# Patient Record
Sex: Female | Born: 1945 | Race: Black or African American | Hispanic: No | Marital: Married | Smoking: Current some day smoker
Health system: Southern US, Community
[De-identification: ages and names within clinical notes are randomized; demographics above are authoritative.]

---
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
## 1976-11-18 HISTORY — PX: TUBAL LIGATION: SHX77

## 2012-07-19 ENCOUNTER — Inpatient Hospital Stay (HOSPITAL_COMMUNITY)
Admission: EM | Admit: 2012-07-19 | Discharge: 2012-07-30 | DRG: 337 | Disposition: A | Discharge status: Home / Self Care | Payer: Medicare (Managed Care) | Attending: General Surgery | Admitting: General Surgery

## 2012-07-19 ENCOUNTER — Encounter (HOSPITAL_COMMUNITY): Payer: Self-pay | Admitting: Emergency Medicine

## 2012-07-19 ENCOUNTER — Emergency Department (HOSPITAL_COMMUNITY): Payer: Medicare (Managed Care)

## 2012-07-19 DIAGNOSIS — K560 Paralytic ileus: Secondary | ICD-10-CM | POA: Diagnosis not present

## 2012-07-19 DIAGNOSIS — R109 Unspecified abdominal pain: Secondary | ICD-10-CM

## 2012-07-19 DIAGNOSIS — R112 Nausea with vomiting, unspecified: Secondary | ICD-10-CM | POA: Diagnosis present

## 2012-07-19 DIAGNOSIS — F17200 Nicotine dependence, unspecified, uncomplicated: Secondary | ICD-10-CM | POA: Diagnosis present

## 2012-07-19 DIAGNOSIS — K56609 Unspecified intestinal obstruction, unspecified as to partial versus complete obstruction: Secondary | ICD-10-CM | POA: Diagnosis present

## 2012-07-19 DIAGNOSIS — K565 Intestinal adhesions [bands], unspecified as to partial versus complete obstruction: Principal | ICD-10-CM | POA: Diagnosis present

## 2012-07-19 DIAGNOSIS — Z5331 Laparoscopic surgical procedure converted to open procedure: Secondary | ICD-10-CM

## 2012-07-19 LAB — COMPREHENSIVE METABOLIC PANEL
AST: 21 U/L (ref 0–37)
Albumin: 4.4 g/dL (ref 3.5–5.2)
Alkaline Phosphatase: 85 U/L (ref 39–117)
Chloride: 97 mEq/L (ref 96–112)
Potassium: 3.6 mEq/L (ref 3.5–5.1)
Total Bilirubin: 0.5 mg/dL (ref 0.3–1.2)

## 2012-07-19 LAB — CBC WITH DIFFERENTIAL/PLATELET
Basophils Absolute: 0 10*3/uL (ref 0.0–0.1)
Basophils Relative: 0 % (ref 0–1)
Hemoglobin: 15.8 g/dL — ABNORMAL HIGH (ref 12.0–15.0)
MCHC: 34.4 g/dL (ref 30.0–36.0)
Neutro Abs: 6.9 10*3/uL (ref 1.7–7.7)
Neutrophils Relative %: 57 % (ref 43–77)
Platelets: 263 10*3/uL (ref 150–400)
RDW: 13.8 % (ref 11.5–15.5)

## 2012-07-19 LAB — LIPASE, BLOOD: Lipase: 34 U/L (ref 11–59)

## 2012-07-19 MED ORDER — SODIUM CHLORIDE 0.9 % IV BOLUS (SEPSIS): 1000.0000 mL | Freq: Once | INTRAVENOUS | Status: DC

## 2012-07-19 MED ORDER — FAMOTIDINE IN NACL 20-0.9 MG/50ML-% IV SOLN
20.0000 mg | Freq: Once | INTRAVENOUS | Status: AC
  Administered 2012-07-19: 20 mg via INTRAVENOUS
  Filled 2012-07-19: qty 50

## 2012-07-19 MED ORDER — SODIUM CHLORIDE 0.9 % IV SOLN: 1000.0000 mL | INTRAVENOUS | Status: DC

## 2012-07-19 MED ORDER — SODIUM CHLORIDE 0.9 % IV SOLN
1000.0000 mL | Freq: Once | INTRAVENOUS | Status: AC
  Administered 2012-07-19: 1000 mL via INTRAVENOUS

## 2012-07-19 MED ORDER — ONDANSETRON HCL 4 MG/2ML IJ SOLN
4.0000 mg | Freq: Once | INTRAMUSCULAR | Status: AC
  Administered 2012-07-19: 4 mg via INTRAVENOUS
  Filled 2012-07-19: qty 2

## 2012-07-19 MED ORDER — GI COCKTAIL ~~LOC~~
30.0000 mL | Freq: Once | ORAL | Status: AC
  Administered 2012-07-19: 30 mL via ORAL
  Filled 2012-07-19: qty 30

## 2012-07-19 MED ORDER — SODIUM CHLORIDE 0.9 % IV BOLUS (SEPSIS)
1000.0000 mL | Freq: Once | INTRAVENOUS | Status: AC
  Administered 2012-07-19: 1000 mL via INTRAVENOUS

## 2012-07-19 NOTE — ED Notes (Signed)
Pt made aware of need for urine specimen. Will continue to monitor 

## 2012-07-19 NOTE — ED Notes (Signed)
ZOX:[id number]<AV> Expected date:07/19/12<BR> Expected time: 6:01 PM<BR> Means of arrival:Ambulance<BR> Comments:<BR> Knee Injury

## 2012-07-19 NOTE — ED Provider Notes (Signed)
History     CSN: [id number]  Arrival date & time 07/19/12  8295   First MD Initiated Contact with Patient 07/19/12 1904      Chief Complaint  Patient presents with  . Emesis  . Abdominal Pain    (Consider location/radiation/quality/duration/timing/severity/associated sxs/prior treatment) HPI  Patient relates she's here visiting from [place]. She states 2 nights ago the family went out PE and she and another woman ate fried shrimp, salad and chicken that was pink in the middle. She reports about 6 hours later which was around midnight they both got sick. Her friend however was improved by the next morning. She states she had diffuse abdominal pain with nausea and vomiting. She denies any diarrhea or urgency or defecate. She's unsure of having fever. She states she has some soreness in every once in a while gets a small pain that shoots through her abdomen. She states she feels weak but she denies orthostasis. She also has had some decreased urine output. She was seen at an urgent care and they thought she had free air on her x-rays which I have reviewed. I do not see the free air there the PA thought was present.  PCP OOT  History reviewed. No pertinent past medical history.  Past Surgical History  Procedure Date  . Ovarian cyst surgery 1966  . Appendectomy 1965  . Tubal ligation 1978    No family history on file.  History  Substance Use Topics  . Smoking status: Current Some Day Smoker -- 0.5 packs/day  . Smokeless tobacco: Not on file  . Alcohol Use: No  visiting from [place]  OB History    Grav Para Term Preterm Abortions TAB SAB Ect Mult Living                  Review of Systems  All other systems reviewed and are negative.    Allergies  Penicillins  Home Medications  No current outpatient prescriptions on file.  BP 164/104  Pulse 64  Temp 98.9 F (37.2 C) (Oral)  Resp 18  SpO2 100%  Vital signs normal    Physical Exam  Nursing note and vitals  reviewed. Constitutional: She is oriented to person, place, and time. She appears well-developed and well-nourished.  Non-toxic appearance. She does not appear ill. No distress.       Patient is smiling and pleasant sitting up in her stretcher  HENT:  Head: Normocephalic and atraumatic.  Right Ear: External ear normal.  Left Ear: External ear normal.  Nose: Nose normal. No mucosal edema or rhinorrhea.  Mouth/Throat: Oropharynx is clear and moist and mucous membranes are normal. No dental abscesses or uvula swelling.  Eyes: Conjunctivae and EOM are normal. Pupils are equal, round, and reactive to light.  Neck: Normal range of motion and full passive range of motion without pain. Neck supple.  Cardiovascular: Normal rate, regular rhythm and normal heart sounds.  Exam reveals no gallop and no friction rub.   No murmur heard. Pulmonary/Chest: Effort normal and breath sounds normal. No respiratory distress. She has no wheezes. She has no rhonchi. She has no rales. She exhibits no tenderness and no crepitus.  Abdominal: Soft. Normal appearance and bowel sounds are normal. She exhibits no distension. There is tenderness. There is no rebound and no guarding.       Patient has some mild tenderness in her epigastric and left upper quadrant. There is no guarding or rebound.  Musculoskeletal: Normal range of motion. She exhibits  no edema and no tenderness.       Moves all extremities well.   Neurological: She is alert and oriented to person, place, and time. She has normal strength. No cranial nerve deficit.  Skin: Skin is warm, dry and intact. No rash noted. No erythema. No pallor.  Psychiatric: She has a normal mood and affect. Her speech is normal and behavior is normal. Her mood appears not anxious.    ED Course  Procedures (including critical care time)   Medications  sodium chloride 0.9 % bolus 1,000 mL (not administered)  0.9 %  sodium chloride infusion (1000 mL Intravenous New Bag/Given  07/19/12 2023)    Followed by  0.9 %  sodium chloride infusion (not administered)  ondansetron (ZOFRAN) injection 4 mg (4 mg Intravenous Given 07/19/12 2027)  sodium chloride 0.9 % bolus 1,000 mL (1000 mL Intravenous Given 07/19/12 2242)  gi cocktail (Maalox,Lidocaine,Donnatal) (30 mL Oral Given 07/19/12 2237)  famotidine (PEPCID) IVPB 20 mg (20 mg Intravenous Given 07/19/12 2237)   Pt continues to have mild pain in her epigastric/LUQ. Will proceed with CT AP  Results for orders placed during the hospital encounter of 07/19/12  CBC WITH DIFFERENTIAL      Component Value Range   WBC 11.9 (*) 4.0 - 10.5 K/uL   RBC 5.10  3.87 - 5.11 MIL/uL   Hemoglobin 15.8 (*) 12.0 - 15.0 g/dL   HCT 16.1  09.6 - 04.5 %   MCV 90.0  78.0 - 100.0 fL   MCH 31.0  26.0 - 34.0 pg   MCHC 34.4  30.0 - 36.0 g/dL   RDW 40.9  81.1 - 91.4 %   Platelets 263  150 - 400 K/uL   Neutrophils Relative 57  43 - 77 %   Neutro Abs 6.9  1.7 - 7.7 K/uL   Lymphocytes Relative 32  12 - 46 %   Lymphs Abs 3.9  0.7 - 4.0 K/uL   Monocytes Relative 10  3 - 12 %   Monocytes Absolute 1.1 (*) 0.1 - 1.0 K/uL   Eosinophils Relative 0  0 - 5 %   Eosinophils Absolute 0.0  0.0 - 0.7 K/uL   Basophils Relative 0  0 - 1 %   Basophils Absolute 0.0  0.0 - 0.1 K/uL  COMPREHENSIVE METABOLIC PANEL      Component Value Range   Sodium 136  135 - 145 mEq/L   Potassium 3.6  3.5 - 5.1 mEq/L   Chloride 97  96 - 112 mEq/L   CO2 27  19 - 32 mEq/L   Glucose, Bld 139 (*) 70 - 99 mg/dL   BUN 20  6 - 23 mg/dL   Creatinine, Ser 7.82 (*) 0.50 - 1.10 mg/dL   Calcium 95.6  8.4 - 21.3 mg/dL   Total Protein 7.8  6.0 - 8.3 g/dL   Albumin 4.4  3.5 - 5.2 g/dL   AST 21  0 - 37 U/L   ALT 11  0 - 35 U/L   Alkaline Phosphatase 85  39 - 117 U/L   Total Bilirubin 0.5  0.3 - 1.2 mg/dL   GFR calc non Af Amer 46 (*) >90 mL/min   GFR calc Af Amer 53 (*) >90 mL/min  LIPASE, BLOOD      Component Value Range   Lipase 34  11 - 59 U/L     1. Abdominal pain   2. Nausea  and/or vomiting     Disposition per Dr Weldon Inches  MDM           Ward Givens, MD 07/20/12 0040

## 2012-07-19 NOTE — ED Notes (Signed)
Pt presents w/ 2 day hx of emesis w/ severe abdominal pain, initially pt treated as food poisoning as her companion was also sick. Friday noc developed severe lower abdominal pain, hiccoughs that left a funny taste in her mouth. Entire abdomen is sore. Last emesis 2330 last p.m. No food intake since Friday noc.

## 2012-07-19 NOTE — ED Notes (Signed)
Sent from [facility] : Acute abdomen, possible free air rt costaphrenic angle, obstructive sigmoid/dilated bowel, elevated WBC. Copy of CT scan sent to radiology for reading by our radiologist.

## 2012-07-20 ENCOUNTER — Encounter (HOSPITAL_COMMUNITY): Payer: Self-pay | Admitting: Surgery

## 2012-07-20 DIAGNOSIS — K565 Intestinal adhesions [bands], unspecified as to partial versus complete obstruction: Secondary | ICD-10-CM

## 2012-07-20 DIAGNOSIS — K56609 Unspecified intestinal obstruction, unspecified as to partial versus complete obstruction: Secondary | ICD-10-CM | POA: Diagnosis present

## 2012-07-20 LAB — URINALYSIS, ROUTINE W REFLEX MICROSCOPIC
Glucose, UA: NEGATIVE mg/dL
Hgb urine dipstick: NEGATIVE
Ketones, ur: NEGATIVE mg/dL
Protein, ur: NEGATIVE mg/dL
pH: 6.5 (ref 5.0–8.0)

## 2012-07-20 MED ORDER — ONDANSETRON HCL 4 MG/2ML IJ SOLN
4.0000 mg | Freq: Four times a day (QID) | INTRAMUSCULAR | Status: DC | PRN
  Administered 2012-07-23: 4 mg via INTRAVENOUS
  Filled 2012-07-20 (×2): qty 2

## 2012-07-20 MED ORDER — PHENOL 1.4 % MT LIQD
1.0000 | OROMUCOSAL | Status: DC | PRN
  Administered 2012-07-22: 1 via OROMUCOSAL
  Filled 2012-07-20: qty 177

## 2012-07-20 MED ORDER — IOHEXOL 300 MG/ML  SOLN
100.0000 mL | Freq: Once | INTRAMUSCULAR | Status: AC | PRN
  Administered 2012-07-20: 100 mL via INTRAVENOUS

## 2012-07-20 MED ORDER — KCL IN DEXTROSE-NACL 20-5-0.45 MEQ/L-%-% IV SOLN
INTRAVENOUS | Status: DC
  Administered 2012-07-20 – 2012-07-25 (×7): via INTRAVENOUS
  Administered 2012-07-25: 125 mL/h via INTRAVENOUS
  Administered 2012-07-26: 06:00:00 via INTRAVENOUS
  Administered 2012-07-26: 125 mL/h via INTRAVENOUS
  Administered 2012-07-27 – 2012-07-29 (×4): via INTRAVENOUS
  Filled 2012-07-20 (×29): qty 1000

## 2012-07-20 MED ORDER — HYDROMORPHONE HCL PF 1 MG/ML IJ SOLN
1.0000 mg | INTRAMUSCULAR | Status: DC | PRN
  Administered 2012-07-20 – 2012-07-22 (×3): 1 mg via INTRAVENOUS
  Filled 2012-07-20 (×3): qty 1

## 2012-07-20 MED ORDER — PANTOPRAZOLE SODIUM 40 MG IV SOLR
40.0000 mg | Freq: Every day | INTRAVENOUS | Status: DC
  Administered 2012-07-20 – 2012-07-29 (×10): 40 mg via INTRAVENOUS
  Filled 2012-07-20 (×11): qty 40

## 2012-07-20 NOTE — ED Notes (Signed)
Received call from Dr Sid Falcon, will be rounding to assess patient this am . First thing. Will make patient aware that Attend doctor will see her and assign her a floor as soon as he can.

## 2012-07-20 NOTE — ED Notes (Signed)
After insertion, and placement checking of NG tube patient placed to low wall suction, got out immediate tan to clear secretion - 

## 2012-07-20 NOTE — H&P (Signed)
[name] is an 66 y.o. female.   General Surgery [facility]  Chief Complaint: abdominal pain, nausea and vomiting - small bowel obstruction  HPI: patient is a 66 year old black female from [place], [place], traveling through [place] vacation.  The patient developed lower abdominal pain followed by onset of nausea and vomiting 3 days ago. She suspected food poisoning. However she denies fevers or chills. She has had no bowel movements over the past 2 days. She denies passing flatus. With persistent nausea and vomiting, the patient presented to the emergency department for evaluation. CT scan of the abdomen and pelvis shows evidence of small bowel up structure likely secondary to adhesions.  Past surgical history is notable for appendectomy and left oophorectomy.  Medical history is otherwise unremarkable  Patient is admitted to the surgical service for management of small bowel obstruction.  History reviewed. No pertinent past medical history.  Past Surgical History  Procedure Date  . Ovarian cyst surgery 1966  . Appendectomy 1965  . Tubal ligation 1978    No family history on file. Social History:  reports that she has been smoking.  She does not have any smokeless tobacco history on file. She reports that she does not drink alcohol or use illicit drugs.  Allergies:  Allergies  Allergen Reactions  . Penicillins Anaphylaxis     (Not in a hospital admission)  Results for orders placed during the hospital encounter of 07/19/12 (from the past 48 hour(s))  CBC WITH DIFFERENTIAL     Status: Abnormal   Collection Time   07/19/12  8:00 PM      Component Value Range Comment   WBC 11.9 (*) 4.0 - 10.5 K/uL    RBC 5.10  3.87 - 5.11 MIL/uL    Hemoglobin 15.8 (*) 12.0 - 15.0 g/dL    HCT 16.1  09.6 - 04.5 %    MCV 90.0  78.0 - 100.0 fL    MCH 31.0  26.0 - 34.0 pg    MCHC 34.4  30.0 - 36.0 g/dL    RDW 40.9  81.1 - 91.4 %    Platelets 263  150 - 400 K/uL     Neutrophils Relative 57  43 - 77 %    Neutro Abs 6.9  1.7 - 7.7 K/uL    Lymphocytes Relative 32  12 - 46 %    Lymphs Abs 3.9  0.7 - 4.0 K/uL    Monocytes Relative 10  3 - 12 %    Monocytes Absolute 1.1 (*) 0.1 - 1.0 K/uL    Eosinophils Relative 0  0 - 5 %    Eosinophils Absolute 0.0  0.0 - 0.7 K/uL    Basophils Relative 0  0 - 1 %    Basophils Absolute 0.0  0.0 - 0.1 K/uL   COMPREHENSIVE METABOLIC PANEL     Status: Abnormal   Collection Time   07/19/12  8:00 PM      Component Value Range Comment   Sodium 136  135 - 145 mEq/L    Potassium 3.6  3.5 - 5.1 mEq/L    Chloride 97  96 - 112 mEq/L    CO2 27  19 - 32 mEq/L    Glucose, Bld 139 (*) 70 - 99 mg/dL    BUN 20  6 - 23 mg/dL    Creatinine, Ser 7.82 (*) 0.50 - 1.10 mg/dL    Calcium 95.6  8.4 - 10.5 mg/dL    Total Protein  7.8  6.0 - 8.3 g/dL    Albumin 4.4  3.5 - 5.2 g/dL    AST 21  0 - 37 U/L    ALT 11  0 - 35 U/L    Alkaline Phosphatase 85  39 - 117 U/L    Total Bilirubin 0.5  0.3 - 1.2 mg/dL    GFR calc non Af Amer 46 (*) >90 mL/min    GFR calc Af Amer 53 (*) >90 mL/min   LIPASE, BLOOD     Status: Normal   Collection Time   07/19/12  8:00 PM      Component Value Range Comment   Lipase 34  11 - 59 U/L   URINALYSIS, ROUTINE W REFLEX MICROSCOPIC     Status: Abnormal   Collection Time   07/20/12  3:26 AM      Component Value Range Comment   Color, Urine YELLOW  YELLOW    APPearance CLEAR  CLEAR    Specific Gravity, Urine 1.034 (*) 1.005 - 1.030    pH 6.5  5.0 - 8.0    Glucose, UA NEGATIVE  NEGATIVE mg/dL    Hgb urine dipstick NEGATIVE  NEGATIVE    Bilirubin Urine NEGATIVE  NEGATIVE    Ketones, ur NEGATIVE  NEGATIVE mg/dL    Protein, ur NEGATIVE  NEGATIVE mg/dL    Urobilinogen, UA 0.2  0.0 - 1.0 mg/dL    Nitrite NEGATIVE  NEGATIVE    Leukocytes, UA NEGATIVE  NEGATIVE MICROSCOPIC NOT DONE ON URINES WITH NEGATIVE PROTEIN, BLOOD, LEUKOCYTES, NITRITE, OR GLUCOSE <1000 mg/dL.   Ct Abdomen Pelvis W Contrast  07/20/2012   *RADIOLOGY REPORT*  Clinical Data: Lower abdominal pain, nausea, vomiting, abnormal outside radiographs demonstrating bowel dilatation and question free air; WBC = 11.9 K  CT ABDOMEN AND PELVIS WITH CONTRAST  Technique:  Multidetector CT imaging of the abdomen and pelvis was performed following the standard protocol during bolus administration of intravenous contrast. Sagittal and coronal MPR images reconstructed from axial data set.  Contrast: OMNIPAQUE IOHEXOL 300 MG/ML  SOLN Dilute oral contrast.  Comparison: None  Findings: Lung bases clear. Small cyst within liver, largest right lobe 12 x 9 mm image 32. Liver, spleen, pancreas, kidneys, and right adrenal gland otherwise normal appearance. Left adrenal nodule 2.1 x 1.3 cm image 27, showing significant washout of contrast on delayed images, question small adenoma.  Mildly distended stomach with dilated proximal and decompressed distal small bowel loops compatible with small bowel obstruction. Transition zone from dilated to nondilated small bowel occurs in the left pelvis. Appendix surgically absent by history. Small amount of nonspecific free pelvic fluid. Unremarkable bladder and uterus. Minimal diverticulosis of sigmoid colon without evidence of diverticulitis. No mass, adenopathy, free air, or hernia. No acute osseous findings.  IMPRESSION: Small bowel obstruction with transition from dilated to nondilated small bowel occurring in the left pelvis, question adhesion. Small amount free pelvic fluid is seen but no definite bowel wall thickening or free intraperitoneal air identified. Small left adrenal nodule 2.1 x 1.3 cm question adenoma. Sigmoid diverticulosis.   Original Report Authenticated By: Lollie Marrow, M.D.     Review of Systems  Constitutional: Negative.   HENT: Negative.   Eyes: Negative.   Respiratory: Negative.   Cardiovascular: Negative.   Gastrointestinal: Positive for nausea, vomiting and abdominal pain. Negative for heartburn,  diarrhea, constipation, blood in stool and melena.  Genitourinary: Negative.   Musculoskeletal: Negative.   Skin: Negative.   Neurological: Negative.  Endo/Heme/Allergies: Negative.   Psychiatric/Behavioral: Negative.     Blood pressure 161/64, pulse 63, temperature 98.9 F (37.2 C), temperature source Oral, resp. rate 18, SpO2 95.00%. Physical Exam  Constitutional: She is oriented to person, place, and time. She appears well-developed and well-nourished. No distress.  HENT:  Head: Normocephalic and atraumatic.  Right Ear: External ear normal.  Left Ear: External ear normal.  Nose: Nose normal.  Mouth/Throat: Oropharynx is clear and moist.  Eyes: Conjunctivae and EOM are normal. Pupils are equal, round, and reactive to light. No scleral icterus.  Neck: Normal range of motion. Neck supple. No tracheal deviation present. No thyromegaly present.  Cardiovascular: Normal rate, regular rhythm and normal heart sounds.   No murmur heard. Respiratory: Effort normal and breath sounds normal. No respiratory distress. She has no wheezes. She has no rales.  GI: Soft. Bowel sounds are normal. She exhibits distension (mild). She exhibits no mass. There is tenderness (mild, diffuse). There is no rebound and no guarding.  Musculoskeletal: Normal range of motion. She exhibits no edema and no tenderness.  Lymphadenopathy:    She has no cervical adenopathy.  Neurological: She is alert and oriented to person, place, and time.  Skin: Skin is warm and dry. She is not diaphoretic.  Psychiatric: She has a normal mood and affect. Her behavior is normal. Judgment and thought content normal.     Assessment/Plan Small bowel obstruction, likely secondary to adhesions  - NG decompression, IV hydration, NPO except ice chips  - ambulate  - repeat labs and AXR in AM 9/3  Velora Heckler, MD, [facility] Office: [phone]    Nhi Butrum M 07/20/2012, 8:01 AM

## 2012-07-20 NOTE — ED Provider Notes (Addendum)
Assumed care from Dr. Lynelle Doctor.  Reviewed the chart and reevaluated the patient.  I agree with the assessment and plan.  I reviewed the CAT scan, which showed a bowel obstruction.  Therefore, I ordered an NG tube and explained the necessary.  Treatment and need for hospitalization to the patient  Courtney Guppy, MD 07/20/12 0347  4:07 AM Spoke with Dr. Gerrit Friends. He will admit for tx of sbo.   He agrees with plan for ng and ivf.   Courtney Guppy, MD 07/20/12 [phone]

## 2012-07-21 ENCOUNTER — Inpatient Hospital Stay (HOSPITAL_COMMUNITY): Payer: Medicare (Managed Care)

## 2012-07-21 LAB — CBC
Hemoglobin: 13.8 g/dL (ref 12.0–15.0)
MCH: 31 pg (ref 26.0–34.0)
RBC: 4.45 MIL/uL (ref 3.87–5.11)
WBC: 7.7 10*3/uL (ref 4.0–10.5)

## 2012-07-21 LAB — BASIC METABOLIC PANEL
CO2: 25 mEq/L (ref 19–32)
GFR calc non Af Amer: 54 mL/min — ABNORMAL LOW (ref >90)
Glucose, Bld: 144 mg/dL — ABNORMAL HIGH (ref 70–99)
Potassium: 3.7 mEq/L (ref 3.5–5.1)
Sodium: 132 mEq/L — ABNORMAL LOW (ref 135–145)

## 2012-07-21 LAB — PROTIME-INR: Prothrombin Time: 13.5 seconds (ref 11.6–15.2)

## 2012-07-21 MED ORDER — ENOXAPARIN SODIUM 40 MG/0.4ML ~~LOC~~ SOLN
40.0000 mg | SUBCUTANEOUS | Status: DC
  Administered 2012-07-21 – 2012-07-22 (×2): 40 mg via SUBCUTANEOUS
  Filled 2012-07-21 (×3): qty 0.4

## 2012-07-21 NOTE — Progress Notes (Signed)
Subjective: No n/v. No abd pain. Feel somewhat better. No flatus/bm. +multiple voids. NG about 700  Objective: Vital signs in last 24 hours: Temp:  [97.4 F (36.3 C)-98.8 F (37.1 C)] 97.4 F (36.3 C) (09/03 0615) Pulse Rate:  [50-60] 60  (09/03 0615) Resp:  [16-18] 18  (09/03 0615) BP: (146-186)/(51-84) 147/67 mmHg (09/03 0615) SpO2:  [94 %-100 %] 96 % (09/03 0615) Weight:  [180 lb 3.2 oz (81.738 kg)] 180 lb 3.2 oz (81.738 kg) (09/02 1300)    Intake/Output from previous day: 09/02 0701 - 09/03 0700 In: 2725.8 [I.V.:2695.8; NG/GT:30] Out: 600 [Emesis/NG output:600] Intake/Output this shift:    Alert, approp cta Reg Soft, obese, nt +scds  Lab Results:   Basename 07/21/12 0440 07/19/12 2000  WBC 7.7 11.9*  HGB 13.8 15.8*  HCT 40.6 45.9  PLT 216 263   BMET  Basename 07/21/12 0440 07/19/12 2000  NA 132* 136  K 3.7 3.6  CL 99 97  CO2 25 27  GLUCOSE 144* 139*  BUN 12 20  CREATININE 1.05 1.20*  CALCIUM 8.4 10.2   PT/INR  Basename 07/21/12 0440  LABPROT 13.5  INR 1.01   ABG No results found for this basename: PHART:2,PCO2:2,PO2:2,HCO3:2 in the last 72 hours  Studies/Results: Ct Abdomen Pelvis W Contrast  07/20/2012  *RADIOLOGY REPORT*  Clinical Data: Lower abdominal pain, nausea, vomiting, abnormal outside radiographs demonstrating bowel dilatation and question free air; WBC = 11.9 K  CT ABDOMEN AND PELVIS WITH CONTRAST  Technique:  Multidetector CT imaging of the abdomen and pelvis was performed following the standard protocol during bolus administration of intravenous contrast. Sagittal and coronal MPR images reconstructed from axial data set.  Contrast: OMNIPAQUE IOHEXOL 300 MG/ML  SOLN Dilute oral contrast.  Comparison: None  Findings: Lung bases clear. Small cyst within liver, largest right lobe 12 x 9 mm image 32. Liver, spleen, pancreas, kidneys, and right adrenal gland otherwise normal appearance. Left adrenal nodule 2.1 x 1.3 cm image 27, showing  significant washout of contrast on delayed images, question small adenoma.  Mildly distended stomach with dilated proximal and decompressed distal small bowel loops compatible with small bowel obstruction. Transition zone from dilated to nondilated small bowel occurs in the left pelvis. Appendix surgically absent by history. Small amount of nonspecific free pelvic fluid. Unremarkable bladder and uterus. Minimal diverticulosis of sigmoid colon without evidence of diverticulitis. No mass, adenopathy, free air, or hernia. No acute osseous findings.  IMPRESSION: Small bowel obstruction with transition from dilated to nondilated small bowel occurring in the left pelvis, question adhesion. Small amount free pelvic fluid is seen but no definite bowel wall thickening or free intraperitoneal air identified. Small left adrenal nodule 2.1 x 1.3 cm question adenoma. Sigmoid diverticulosis.   Original Report Authenticated By: Lollie Marrow, M.D.    Dg Abd Portable 2v  07/21/2012  *RADIOLOGY REPORT*  Clinical Data: Small bowel obstruction  PORTABLE ABDOMEN - 2 VIEW  Comparison: 07/20/2012 CT abdomen and pelvis  Findings: Persistent dilatation of small bowel loops containing dilute contrast. Nasogastric tube within decompressed stomach. No free intraperitoneal air. Lung bases clear. 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 Osseous demineralization.  IMPRESSION: Persistent dilatation of fluid-filled small bowel loops consistent with obstruction.   Original Report Authenticated By: Lollie Marrow, M.D.     Anti-infectives: Anti-infectives    None      Assessment/Plan: pSBO - no fever, tachy, leukocytosis; cont non-surgical management for now. Repeat labs and imaging in am. Discussed disease  process with family. Start lovenox for DVT prophylaxis.  [name]. Andrey Campanile, MD, FACS General, Bariatric, & Minimally Invasive Surgery [facility] [place]   LOS: 2 days    Atilano Ina 07/21/2012

## 2012-07-21 NOTE — Care Management Note (Signed)
    Page 1 of 1   07/30/2012     11:42:18 AM   CARE MANAGEMENT NOTE 07/30/2012  Patient:  [name], [name]   Account Number:  [id number]  Date Initiated:  07/21/2012  Documentation initiated by:  Lorenda Ishihara  Subjective/Objective Assessment:   66 yo female admitted with SBO. PTA lived in [place], traveling thru [place].     Action/Plan:   Anticipated DC Date:  07/31/2012   Anticipated DC Plan:  HOME/SELF CARE      DC Planning Services  CM consult      Choice offered to / List presented to:             Status of service:  Completed, signed off Medicare Important Message given?   (If response is "NO", the following Medicare IM given date fields will be blank) Date Medicare IM given:   Date Additional Medicare IM given:    Discharge Disposition:  HOME/SELF CARE  Per UR Regulation:  Reviewed for med. necessity/level of care/duration of stay  If discussed at Long Length of Stay Meetings, dates discussed:    Comments:  07-24-12 Lorenda Ishihara RN CM to OR 07-23-12 for SBO, lysis of adhesions

## 2012-07-22 ENCOUNTER — Inpatient Hospital Stay (HOSPITAL_COMMUNITY): Payer: Medicare (Managed Care)

## 2012-07-22 LAB — CBC WITH DIFFERENTIAL/PLATELET
HCT: 38.8 % (ref 36.0–46.0)
Hemoglobin: 13.3 g/dL (ref 12.0–15.0)
Lymphocytes Relative: 36 % (ref 12–46)
Lymphs Abs: 2.4 10*3/uL (ref 0.7–4.0)
MCHC: 34.3 g/dL (ref 30.0–36.0)
Monocytes Absolute: 0.7 10*3/uL (ref 0.1–1.0)
Monocytes Relative: 11 % (ref 3–12)
Neutro Abs: 3.4 10*3/uL (ref 1.7–7.7)
Neutrophils Relative %: 51 % (ref 43–77)
RBC: 4.25 MIL/uL (ref 3.87–5.11)
WBC: 6.6 10*3/uL (ref 4.0–10.5)

## 2012-07-22 LAB — BASIC METABOLIC PANEL
BUN: 9 mg/dL (ref 6–23)
Chloride: 102 mEq/L (ref 96–112)
GFR calc Af Amer: 62 mL/min — ABNORMAL LOW (ref >90)
Potassium: 3.9 mEq/L (ref 3.5–5.1)
Sodium: 134 mEq/L — ABNORMAL LOW (ref 135–145)

## 2012-07-22 MED ORDER — MENTHOL 3 MG MT LOZG
1.0000 | LOZENGE | OROMUCOSAL | Status: DC | PRN
  Administered 2012-07-24: 3 mg via ORAL
  Filled 2012-07-22 (×3): qty 9

## 2012-07-22 NOTE — Progress Notes (Signed)
Subjective: No abd pain, just "sore"; no nausea; no flatus; "had a little squirt from bottom"; ambulated several times yesterday  Objective: Vital signs in last 24 hours: Temp:  [97.9 F (36.6 C)-98.2 F (36.8 C)] 98.1 F (36.7 C) 08/04/2023 0625) Pulse Rate:  [60-63] 60  Aug 04, 2023 0625) Resp:  [17-18] 18  08/04/23 0625) BP: (137-174)/(69-82) 137/71 mmHg 08-04-2023 0625) SpO2:  [96 %-97 %] 97 % 08/04/23 0625) Last BM Date: 07/21/12  Intake/Output from previous day: 09/03 0701 - 2023-08-04 0700 In: 1648.3 [I.V.:1608.3; NG/GT:40] Out: 2500 [Urine:2150; Emesis/NG output:350] Intake/Output this shift: Total I/O In: 1903.3 [I.V.:1883.3; NG/GT:20] Out: 750 [Urine:250; Emesis/NG output:500]  Alert, nad cta Reg Soft, mild distension; some scattered BS. Nontender. +scds  Lab Results:   Basename 08/03/12 0410 07/21/12 0440  WBC 6.6 7.7  HGB 13.3 13.8  HCT 38.8 40.6  PLT 187 216   BMET  Basename 08-03-12 0410 07/21/12 0440  NA 134* 132*  K 3.9 3.7  CL 102 99  CO2 22 25  GLUCOSE 138* 144*  BUN 9 12  CREATININE 1.06 1.05  CALCIUM 8.5 8.4   PT/INR  Basename 07/21/12 0440  LABPROT 13.5  INR 1.01   ABG No results found for this basename: PHART:2,PCO2:2,PO2:2,HCO3:2 in the last 72 hours  Studies/Results: Dg Abd 2 Views  08-03-2012  *RADIOLOGY REPORT*  Clinical Data: Follow-up small bowel obstruction.  ABDOMEN - 2 VIEW  Comparison: 07/21/2012.  Findings: The NG tube is in the stomach.  The tip is likely in the region of the duodenal bulb.  There are scattered mildly dilated small bowel loops with air-fluid levels consistent with a small bowel obstruction.  No significant change.  IMPRESSION: Persistent small bowel obstruction bowel gas pattern.  No free air.   Original Report Authenticated By: P. Loralie Champagne, M.D.    Dg Abd Portable 2v  07/21/2012  *RADIOLOGY REPORT*  Clinical Data: Small bowel obstruction  PORTABLE ABDOMEN - 2 VIEW  Comparison: 07/20/2012 CT abdomen and pelvis   Findings: Persistent dilatation of small bowel loops containing dilute contrast. Nasogastric tube within decompressed stomach. No free intraperitoneal air. Lung bases clear. 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 Osseous demineralization.  IMPRESSION: Persistent dilatation of fluid-filled small bowel loops consistent with obstruction.   Original Report Authenticated By: Lollie Marrow, M.D.     Anti-infectives: Anti-infectives    None      Assessment/Plan: pSBO  Pt still without significant bowel function. No BM. Little to NO flatus. However no tachy, fever, or leukocytosis. abd films unchanged. Discussed on-going non-surgical management vs surgical management for persistent PSBO.  Discussed role of diagnostic laparoscopy, possible exp lap, lysis of adhesions, possible bowel resection. Discussed risk and benefits of surgery including, but not limited to bleeding, infection (such as wound infection, abdominal abscess), injury to surrounding structures, blood clot formation, urinary retention, incisional hernia, possible anastomotic stricture, possible anastomotic leak, anesthesia risks, pulmonary & cardiac complications such as pneumonia &/or heart attack, need for additional procedures, ileus, & prolonged hospitalization.  We discussed the typical postoperative recovery course, including limitations & restrictions postoperatively. I explained that the likelihood of improvement in their symptoms is good.  Discussed possibility that intestines could get sick (ishcemia) if obstruction worsens which would increase her risk for complications. Pt's husband at bedside.   Pt would like to think about it for some time.  Will re-evaluate in AM.  If no improvement by Thursday, I told the patient we should proceed with surgery in my opinion  [name]. Andrey Campanile, MD, FACS General, Bariatric, & Minimally Invasive Surgery [facility], [place]    LOS: 3 days    Atilano Ina 07/22/2012

## 2012-07-23 ENCOUNTER — Encounter (HOSPITAL_COMMUNITY): Payer: Self-pay | Admitting: Anesthesiology

## 2012-07-23 ENCOUNTER — Encounter (HOSPITAL_COMMUNITY): Payer: Self-pay

## 2012-07-23 ENCOUNTER — Encounter (HOSPITAL_COMMUNITY): Admission: EM | Disposition: A | Discharge status: Home / Self Care | Payer: Self-pay | Source: Home / Self Care

## 2012-07-23 ENCOUNTER — Inpatient Hospital Stay (HOSPITAL_COMMUNITY): Payer: Medicare (Managed Care) | Admitting: Anesthesiology

## 2012-07-23 HISTORY — PX: LAPAROTOMY: SHX154

## 2012-07-23 HISTORY — PX: LAPAROSCOPY: SHX197

## 2012-07-23 LAB — CBC WITH DIFFERENTIAL/PLATELET
Basophils Absolute: 0 10*3/uL (ref 0.0–0.1)
Basophils Relative: 0 % (ref 0–1)
Eosinophils Relative: 1 % (ref 0–5)
HCT: 42 % (ref 36.0–46.0)
Hemoglobin: 14.3 g/dL (ref 12.0–15.0)
Lymphocytes Relative: 37 % (ref 12–46)
MCHC: 34 g/dL (ref 30.0–36.0)
MCV: 90.7 fL (ref 78.0–100.0)
Monocytes Absolute: 0.6 10*3/uL (ref 0.1–1.0)
Monocytes Relative: 9 % (ref 3–12)
Neutro Abs: 3.5 10*3/uL (ref 1.7–7.7)
RDW: 13.2 % (ref 11.5–15.5)

## 2012-07-23 LAB — BASIC METABOLIC PANEL
BUN: 8 mg/dL (ref 6–23)
CO2: 22 mEq/L (ref 19–32)
Chloride: 102 mEq/L (ref 96–112)
Creatinine, Ser: 1.09 mg/dL (ref 0.50–1.10)
Glucose, Bld: 118 mg/dL — ABNORMAL HIGH (ref 70–99)
Potassium: 4 mEq/L (ref 3.5–5.1)

## 2012-07-23 SURGERY — LAPAROSCOPY, DIAGNOSTIC
Anesthesia: General

## 2012-07-23 MED ORDER — SODIUM CHLORIDE 0.9 % IJ SOLN: 9.0000 mL | INTRAMUSCULAR | Status: DC | PRN

## 2012-07-23 MED ORDER — CIPROFLOXACIN IN D5W 400 MG/200ML IV SOLN
400.0000 mg | INTRAVENOUS | Status: AC
  Administered 2012-07-23: 400 mg via INTRAVENOUS
  Filled 2012-07-23: qty 200

## 2012-07-23 MED ORDER — MORPHINE SULFATE (PF) 1 MG/ML IV SOLN
INTRAVENOUS | Status: DC
  Administered 2012-07-23: 1 mg via INTRAVENOUS
  Administered 2012-07-23: 21:00:00 via INTRAVENOUS
  Administered 2012-07-23: 7 mg via INTRAVENOUS
  Administered 2012-07-23: 8 mg via INTRAVENOUS
  Administered 2012-07-24: 3 mg via INTRAVENOUS
  Administered 2012-07-24: 4 mg via INTRAVENOUS
  Administered 2012-07-24: 11 mg via INTRAVENOUS
  Administered 2012-07-24: 9 mg via INTRAVENOUS
  Administered 2012-07-24: 25 mg via INTRAVENOUS
  Administered 2012-07-24: 05:00:00 via INTRAVENOUS
  Administered 2012-07-24: 0.199 mg via INTRAVENOUS
  Administered 2012-07-25: 10:00:00 via INTRAVENOUS
  Administered 2012-07-25: 7 mg via INTRAVENOUS
  Administered 2012-07-25: 9 mg via INTRAVENOUS
  Administered 2012-07-25: 6 mg via INTRAVENOUS
  Administered 2012-07-25: 5 mg via INTRAVENOUS
  Administered 2012-07-25: 3 mg via INTRAVENOUS
  Administered 2012-07-25: 17 mg via INTRAVENOUS
  Administered 2012-07-26: 2 mg via INTRAVENOUS
  Administered 2012-07-26: 1 mg via INTRAVENOUS
  Administered 2012-07-26: 2 mg via INTRAVENOUS
  Administered 2012-07-26: 1 mg via INTRAVENOUS
  Administered 2012-07-26: 3 mg via INTRAVENOUS
  Administered 2012-07-27: 2 mg via INTRAVENOUS
  Administered 2012-07-27 – 2012-07-28 (×5): 1 mg via INTRAVENOUS
  Administered 2012-07-28 (×2): 2 mg via INTRAVENOUS
  Filled 2012-07-23 (×5): qty 25

## 2012-07-23 MED ORDER — PROMETHAZINE HCL 25 MG/ML IJ SOLN: 6.2500 mg | INTRAMUSCULAR | Status: DC | PRN

## 2012-07-23 MED ORDER — SUFENTANIL CITRATE 50 MCG/ML IV SOLN
INTRAVENOUS | Status: DC | PRN
  Administered 2012-07-23 (×3): 10 ug via INTRAVENOUS
  Administered 2012-07-23: 20 ug via INTRAVENOUS

## 2012-07-23 MED ORDER — HYDROMORPHONE HCL PF 1 MG/ML IJ SOLN
0.2500 mg | INTRAMUSCULAR | Status: DC | PRN
  Administered 2012-07-23 (×4): 0.5 mg via INTRAVENOUS

## 2012-07-23 MED ORDER — LACTATED RINGERS IV SOLN
INTRAVENOUS | Status: DC
  Administered 2012-07-23: 1000 mL via INTRAVENOUS

## 2012-07-23 MED ORDER — DEXAMETHASONE SODIUM PHOSPHATE 10 MG/ML IJ SOLN
INTRAMUSCULAR | Status: DC | PRN
  Administered 2012-07-23: 10 mg via INTRAVENOUS

## 2012-07-23 MED ORDER — SUCCINYLCHOLINE CHLORIDE 20 MG/ML IJ SOLN
INTRAMUSCULAR | Status: DC | PRN
  Administered 2012-07-23: 80 mg via INTRAVENOUS

## 2012-07-23 MED ORDER — CISATRACURIUM BESYLATE (PF) 10 MG/5ML IV SOLN
INTRAVENOUS | Status: DC | PRN
  Administered 2012-07-23: 3 mg via INTRAVENOUS
  Administered 2012-07-23: 5 mg via INTRAVENOUS

## 2012-07-23 MED ORDER — LABETALOL HCL 5 MG/ML IV SOLN
5.0000 mg | INTRAVENOUS | Status: DC | PRN
  Administered 2012-07-23 (×2): 5 mg via INTRAVENOUS

## 2012-07-23 MED ORDER — KCL IN DEXTROSE-NACL 20-5-0.45 MEQ/L-%-% IV SOLN
INTRAVENOUS | Status: AC
  Administered 2012-07-23: 1000 mL via INTRAVENOUS
  Filled 2012-07-23: qty 1000

## 2012-07-23 MED ORDER — ONDANSETRON HCL 4 MG/2ML IJ SOLN
INTRAMUSCULAR | Status: DC | PRN
  Administered 2012-07-23: 4 mg via INTRAVENOUS

## 2012-07-23 MED ORDER — GLYCOPYRROLATE 0.2 MG/ML IJ SOLN
INTRAMUSCULAR | Status: DC | PRN
  Administered 2012-07-23: .8 mg via INTRAVENOUS

## 2012-07-23 MED ORDER — LABETALOL HCL 5 MG/ML IV SOLN
INTRAVENOUS | Status: AC
  Filled 2012-07-23: qty 4

## 2012-07-23 MED ORDER — MIDAZOLAM HCL 5 MG/5ML IJ SOLN
INTRAMUSCULAR | Status: DC | PRN
  Administered 2012-07-23: 2 mg via INTRAVENOUS

## 2012-07-23 MED ORDER — HYDROMORPHONE HCL PF 1 MG/ML IJ SOLN
INTRAMUSCULAR | Status: AC
  Filled 2012-07-23: qty 1

## 2012-07-23 MED ORDER — ENOXAPARIN SODIUM 40 MG/0.4ML ~~LOC~~ SOLN
40.0000 mg | SUBCUTANEOUS | Status: DC
  Administered 2012-07-24 – 2012-07-30 (×7): 40 mg via SUBCUTANEOUS
  Filled 2012-07-23 (×8): qty 0.4

## 2012-07-23 MED ORDER — DIPHENHYDRAMINE HCL 12.5 MG/5ML PO ELIX: 12.5000 mg | ORAL_SOLUTION | Freq: Four times a day (QID) | ORAL | Status: DC | PRN

## 2012-07-23 MED ORDER — SODIUM CHLORIDE 0.9 % IR SOLN
Status: DC | PRN
  Administered 2012-07-23: 1000 mL

## 2012-07-23 MED ORDER — LIDOCAINE HCL (CARDIAC) 20 MG/ML IV SOLN
INTRAVENOUS | Status: DC | PRN
  Administered 2012-07-23: 100 mg via INTRAVENOUS

## 2012-07-23 MED ORDER — LACTATED RINGERS IV SOLN
INTRAVENOUS | Status: DC | PRN
  Administered 2012-07-23 (×2): via INTRAVENOUS

## 2012-07-23 MED ORDER — NEOSTIGMINE METHYLSULFATE 1 MG/ML IJ SOLN
INTRAMUSCULAR | Status: DC | PRN
  Administered 2012-07-23: 5 mg via INTRAVENOUS

## 2012-07-23 MED ORDER — DIPHENHYDRAMINE HCL 50 MG/ML IJ SOLN: 12.5000 mg | Freq: Four times a day (QID) | INTRAMUSCULAR | Status: DC | PRN

## 2012-07-23 MED ORDER — METOPROLOL TARTRATE 1 MG/ML IV SOLN
5.0000 mg | Freq: Four times a day (QID) | INTRAVENOUS | Status: DC | PRN
  Administered 2012-07-23 – 2012-07-24 (×2): 5 mg via INTRAVENOUS
  Filled 2012-07-23 (×2): qty 5

## 2012-07-23 MED ORDER — PROPOFOL 10 MG/ML IV BOLUS
INTRAVENOUS | Status: DC | PRN
  Administered 2012-07-23: 150 mg via INTRAVENOUS

## 2012-07-23 MED ORDER — NALOXONE HCL 0.4 MG/ML IJ SOLN: 0.4000 mg | INTRAMUSCULAR | Status: DC | PRN

## 2012-07-23 MED ORDER — METOPROLOL TARTRATE 1 MG/ML IV SOLN: 5.0000 mg | Freq: Four times a day (QID) | INTRAVENOUS | Status: DC | PRN

## 2012-07-23 MED ORDER — ACETAMINOPHEN 10 MG/ML IV SOLN
INTRAVENOUS | Status: DC | PRN
  Administered 2012-07-23: 1000 mg via INTRAVENOUS

## 2012-07-23 MED ORDER — BUPIVACAINE-EPINEPHRINE PF 0.25-1:200000 % IJ SOLN
INTRAMUSCULAR | Status: DC | PRN
  Administered 2012-07-23: 20 mL

## 2012-07-23 SURGICAL SUPPLY — 84 items
APPLICATOR COTTON TIP 6IN STRL (MISCELLANEOUS) ×2 IMPLANT
APPLIER CLIP 5 13 M/L LIGAMAX5 (MISCELLANEOUS)
APPLIER CLIP ROT 10 11.4 M/L (STAPLE)
BLADE EXTENDED COATED 6.5IN (ELECTRODE) ×2 IMPLANT
BLADE HEX COATED 2.75 (ELECTRODE) ×4 IMPLANT
BLADE SURG SZ10 CARB STEEL (BLADE) IMPLANT
CANISTER SUCTION 2500CC (MISCELLANEOUS) ×2 IMPLANT
CANNULA ENDOPATH XCEL 11M (ENDOMECHANICALS) IMPLANT
CHLORAPREP W/TINT 26ML (MISCELLANEOUS) ×2 IMPLANT
CLIP APPLIE 5 13 M/L LIGAMAX5 (MISCELLANEOUS) IMPLANT
CLIP APPLIE ROT 10 11.4 M/L (STAPLE) IMPLANT
CLIP TI LARGE 6 (CLIP) IMPLANT
CLOTH BEACON ORANGE TIMEOUT ST (SAFETY) ×2 IMPLANT
COVER MAYO STAND STRL (DRAPES) ×2 IMPLANT
COVER SURGICAL LIGHT HANDLE (MISCELLANEOUS) ×2 IMPLANT
DECANTER SPIKE VIAL GLASS SM (MISCELLANEOUS) ×2 IMPLANT
DERMABOND ADVANCED (GAUZE/BANDAGES/DRESSINGS)
DERMABOND ADVANCED .7 DNX12 (GAUZE/BANDAGES/DRESSINGS) IMPLANT
DRAPE LAPAROSCOPIC ABDOMINAL (DRAPES) ×2 IMPLANT
DRAPE LG THREE QUARTER DISP (DRAPES) IMPLANT
DRAPE UTILITY XL STRL (DRAPES) ×2 IMPLANT
DRAPE WARM FLUID 44X44 (DRAPE) ×2 IMPLANT
DRESSING TELFA 8X3 (GAUZE/BANDAGES/DRESSINGS) ×2 IMPLANT
DRSG PAD ABDOMINAL 8X10 ST (GAUZE/BANDAGES/DRESSINGS) ×2 IMPLANT
ELECT REM PT RETURN 9FT ADLT (ELECTROSURGICAL) ×2
ELECTRODE REM PT RTRN 9FT ADLT (ELECTROSURGICAL) ×1 IMPLANT
FILTER SMOKE EVAC LAPAROSHD (FILTER) IMPLANT
GLOVE BIO SURGEON STRL SZ7 (GLOVE) ×2 IMPLANT
GLOVE BIO SURGEON STRL SZ7.5 (GLOVE) ×4 IMPLANT
GLOVE BIOGEL M 7.0 STRL (GLOVE) ×2 IMPLANT
GLOVE BIOGEL M STRL SZ7.5 (GLOVE) IMPLANT
GLOVE BIOGEL PI IND STRL 7.0 (GLOVE) ×1 IMPLANT
GLOVE BIOGEL PI INDICATOR 7.0 (GLOVE) ×1
GLOVE ECLIPSE 7.5 STRL STRAW (GLOVE) ×2 IMPLANT
GLOVE INDICATOR 8.0 STRL GRN (GLOVE) ×4 IMPLANT
GOWN PREVENTION PLUS LG XLONG (DISPOSABLE) ×6 IMPLANT
GOWN PREVENTION PLUS XLARGE (GOWN DISPOSABLE) ×2 IMPLANT
GOWN STRL NON-REIN LRG LVL3 (GOWN DISPOSABLE) ×6 IMPLANT
GOWN STRL REIN XL XLG (GOWN DISPOSABLE) ×4 IMPLANT
KIT BASIN OR (CUSTOM PROCEDURE TRAY) ×2 IMPLANT
LEGGING LITHOTOMY PAIR STRL (DRAPES) IMPLANT
LIGASURE IMPACT 36 18CM CVD LR (INSTRUMENTS) IMPLANT
NS IRRIG 1000ML POUR BTL (IV SOLUTION) ×4 IMPLANT
PACK GENERAL/GYN (CUSTOM PROCEDURE TRAY) ×2 IMPLANT
PENCIL BUTTON HOLSTER BLD 10FT (ELECTRODE) ×2 IMPLANT
SCALPEL HARMONIC ACE (MISCELLANEOUS) IMPLANT
SET IRRIG TUBING LAPAROSCOPIC (IRRIGATION / IRRIGATOR) IMPLANT
SHEARS FOC LG CVD HARMONIC 17C (MISCELLANEOUS) IMPLANT
SOLUTION ANTI FOG 6CC (MISCELLANEOUS) ×2 IMPLANT
SPONGE GAUZE 4X4 12PLY (GAUZE/BANDAGES/DRESSINGS) ×2 IMPLANT
SPONGE LAP 18X18 X RAY DECT (DISPOSABLE) ×2 IMPLANT
STAPLER VISISTAT (STAPLE) ×2 IMPLANT
STAPLER VISISTAT 35W (STAPLE) ×2 IMPLANT
STRIP CLOSURE SKIN 1/2X4 (GAUZE/BANDAGES/DRESSINGS) IMPLANT
SUCTION POOLE TIP (SUCTIONS) ×4 IMPLANT
SUT PDS AB 1 CTX 36 (SUTURE) IMPLANT
SUT PDS AB 1 TP1 96 (SUTURE) ×4 IMPLANT
SUT PDS AB 3-0 CT2 27 (SUTURE) IMPLANT
SUT PDS AB 3-0 SH 27 (SUTURE) ×2 IMPLANT
SUT PDS AB 4-0 SH 27 (SUTURE) IMPLANT
SUT PROLENE 2 0 BLUE (SUTURE) IMPLANT
SUT PROLENE 2 0 KS (SUTURE) IMPLANT
SUT PROLENE 2 0 SH DA (SUTURE) IMPLANT
SUT SILK 2 0 (SUTURE) ×3
SUT SILK 2 0 SH CR/8 (SUTURE) ×6 IMPLANT
SUT SILK 2 0SH CR/8 30 (SUTURE) IMPLANT
SUT SILK 2-0 18XBRD TIE 12 (SUTURE) ×2 IMPLANT
SUT SILK 2-0 30XBRD TIE 12 (SUTURE) ×1 IMPLANT
SUT SILK 3 0 (SUTURE) ×3
SUT SILK 3 0 SH CR/8 (SUTURE) ×4 IMPLANT
SUT SILK 3-0 18XBRD TIE 12 (SUTURE) ×3 IMPLANT
SYS LAPSCP GELPORT 120MM (MISCELLANEOUS)
SYSTEM LAPSCP GELPORT 120MM (MISCELLANEOUS) IMPLANT
TOWEL OR 17X26 10 PK STRL BLUE (TOWEL DISPOSABLE) ×4 IMPLANT
TRAY FOLEY CATH 14FRSI W/METER (CATHETERS) ×2 IMPLANT
TRAY LAP CHOLE (CUSTOM PROCEDURE TRAY) ×2 IMPLANT
TROCAR BLADELESS OPT 5 75 (ENDOMECHANICALS) ×6 IMPLANT
TROCAR XCEL 12X100 BLDLESS (ENDOMECHANICALS) IMPLANT
TROCAR XCEL BLUNT TIP 100MML (ENDOMECHANICALS) ×2 IMPLANT
TROCAR XCEL NON-BLD 11X100MML (ENDOMECHANICALS) IMPLANT
TUBING INSUFFLATION 10FT LAP (TUBING) ×2 IMPLANT
WATER STERILE IRR 1500ML POUR (IV SOLUTION) ×2 IMPLANT
YANKAUER SUCT BULB TIP 10FT TU (MISCELLANEOUS) ×2 IMPLANT
YANKAUER SUCT BULB TIP NO VENT (SUCTIONS) ×2 IMPLANT

## 2012-07-23 NOTE — OR Nursing (Signed)
1408 late entry by Milana Kidney, RN.

## 2012-07-23 NOTE — Anesthesia Postprocedure Evaluation (Signed)
  Anesthesia Post-op Note  Patient: [name]  Procedure(s) Performed: Procedure(s) (LRB): LAPAROSCOPY DIAGNOSTIC (N/A) EXPLORATORY LAPAROTOMY (N/A) LYSIS OF ADHESION (N/A)  Patient Location: PACU  Anesthesia Type: General  Level of Consciousness: awake and alert   Airway and Oxygen Therapy: Patient Spontanous Breathing  Post-op Pain: mild  Post-op Assessment: Post-op Vital signs reviewed, Patient's Cardiovascular Status Stable, Respiratory Function Stable, Patent Airway and No signs of Nausea or vomiting  Post-op Vital Signs: stable  Complications: No apparent anesthesia complications

## 2012-07-23 NOTE — Anesthesia Procedure Notes (Addendum)
Procedure Name: Intubation Date/Time: 07/23/2012 12:12 PM Performed by: Leroy Libman L Patient Re-evaluated:Patient Re-evaluated prior to inductionOxygen Delivery Method: Circle system utilized Preoxygenation: Pre-oxygenation with 100% oxygen Intubation Type: IV induction, Cricoid Pressure applied and Rapid sequence Laryngoscope Size: Miller and 2 Grade View: Grade II Tube type: Oral Tube size: 7.5 mm Number of attempts: 1 Airway Equipment and Method: Stylet Placement Confirmation: ETT inserted through vocal cords under direct vision,  breath sounds checked- equal and bilateral and positive ETCO2 Secured at: 22 cm Tube secured with: Tape Dental Injury: Teeth and Oropharynx as per pre-operative assessment  Comments: Nasal Gastric tube attached to suction prior to induction

## 2012-07-23 NOTE — Progress Notes (Signed)
  Subjective: No flatus. Still sore. 1-2 pellets (size of cough drops) of hard stool last pm; increasing NG output  Objective: Vital signs in last 24 hours: Temp:  [97.9 F (36.6 C)-98.3 F (36.8 C)] 98.2 F (36.8 C) (09/05 0600) Pulse Rate:  [56-61] 60  (09/05 0600) Resp:  [16-20] 16  (09/05 0600) BP: (164-180)/(72-83) 172/76 mmHg (09/05 0600) SpO2:  [95 %-97 %] 96 % (09/05 0600) Last BM Date: 07/21/12  Intake/Output from previous day: 08-19-2023 0701 - 09/05 0700 In: 4339.8 [I.V.:4299.8; NG/GT:40] Out: 3950 [Urine:1300; Emesis/NG output:2650] Intake/Output this shift:    Alert, nad cta Reg Obese, soft, hypoBS, TTP RLQ to deep palpation  Lab Results:   [facility] 07/23/12 0457 18-Aug-2012 0410  WBC 6.7 6.6  HGB 14.3 13.3  HCT 42.0 38.8  PLT 205 187   BMET  Basename 07/23/12 0457 08-18-12 0410  NA 135 134*  K 4.0 3.9  CL 102 102  CO2 22 22  GLUCOSE 118* 138*  BUN 8 9  CREATININE 1.09 1.06  CALCIUM 8.9 8.5   PT/INR  Basename 07/21/12 0440  LABPROT 13.5  INR 1.01   ABG No results found for this basename: PHART:2,PCO2:2,PO2:2,HCO3:2 in the last 72 hours  Studies/Results: Dg Abd 2 Views  08/18/2012  *RADIOLOGY REPORT*  Clinical Data: Follow-up small bowel obstruction.  ABDOMEN - 2 VIEW  Comparison: 07/21/2012.  Findings: The NG tube is in the stomach.  The tip is likely in the region of the duodenal bulb.  There are scattered mildly dilated small bowel loops with air-fluid levels consistent with a small bowel obstruction.  No significant change.  IMPRESSION: Persistent small bowel obstruction bowel gas pattern.  No free air.   Original Report Authenticated By: P. Loralie Champagne, M.D.     Anti-infectives: Anti-infectives     Start     Dose/Rate Route Frequency Ordered Stop   07/23/12 0745   ciprofloxacin (CIPRO) IVPB 400 mg        400 mg 200 mL/hr over 60 Minutes Intravenous On call to O.R. 07/23/12 0740 07/24/12 0559          Assessment/Plan: psbo  The  pt remains essentially obstipated without any significant improvement in bowel recovery. I believe we have failed non-surgical management. The patient has elected to proceed to the OR for dx laparoscopy, LOA, possible exp lap, possible bowel resection, rare possibility of ostomy  i have extensively counseled the pt regarding the risks/benefits of surgery on 2 occasions yesterday for a total of 30- 40 minutes. All of her questions asked and answered. I discussed the procedure in detail.   We discussed the risks and benefits of surgery yesterday including, but not limited to bleeding, infection (such as wound infection, abdominal abscess), injury to surrounding structures, blood clot formation, urinary retention, incisional hernia, anastomotic stricture, anastomotic leak, anesthesia risks, pulmonary & cardiac complications such as pneumonia &/or heart attack, need for additional procedures, ileus, rare possibility of death & prolonged hospitalization.  We discussed the typical postoperative recovery course, including limitations & restrictions postoperatively. I explained that the likelihood of improvement in their symptoms is good.  [name]. Courtney Campanile, MD, FACS General, Bariatric, & Minimally Invasive Surgery [facility], [place]    LOS: 4 days    [name] 07/23/2012

## 2012-07-23 NOTE — Progress Notes (Signed)
Dr. Council Mechanic in and aware of SBP 190 after pain meds(dilaudid 2mg ). Sleeping after meds. Orders given.

## 2012-07-23 NOTE — Transfer of Care (Signed)
Immediate Anesthesia Transfer of Care Note  Patient: [name]  Procedure(s) Performed: Procedure(s) (LRB) with comments: LAPAROSCOPY DIAGNOSTIC (N/A) EXPLORATORY LAPAROTOMY (N/A) LYSIS OF ADHESION (N/A)  Patient Location: PACU  Anesthesia Type: General  Level of Consciousness: awake, alert  and oriented  Airway & Oxygen Therapy: Patient Spontanous Breathing and Patient connected to face mask oxygen  Post-op Assessment: Report given to PACU RN and Post -op Vital signs reviewed and stable  Post vital signs: Reviewed and stable  Complications: No apparent anesthesia complications

## 2012-07-23 NOTE — Preoperative (Addendum)
Beta Blockers   Reason not to administer Beta Blockers:Not Applicable 

## 2012-07-23 NOTE — Brief Op Note (Signed)
07/19/2012 - 07/23/2012  1:47 PM  PATIENT:  [name]  66 y.o. female  PRE-OPERATIVE DIAGNOSIS:  Partial small bowel obstruction  POST-OPERATIVE DIAGNOSIS: partial small bowel obstruction secondary to closed loop obstruction  PROCEDURE:  Procedure(s) (LRB) with comments: LAPAROSCOPY DIAGNOSTIC (N/A) EXPLORATORY LAPAROTOMY (N/A) LYSIS OF ADHESION (N/A) x 1hr  SURGEON:  Surgeon(s) and Role:    * Atilano Ina, MD,FACS - Primary  PHYSICIAN ASSISTANT: none  ASSISTANTS: Dr Romie Levee   ANESTHESIA:   general  EBL:  Total I/O In: 1000 [I.V.:1000] Out: 175 [Urine:125; Blood:50]  BLOOD ADMINISTERED:none  DRAINS: Nasogastric Tube and Urinary Catheter (Foley)   LOCAL MEDICATIONS USED:  MARCAINE     SPECIMEN:  No Specimen  DISPOSITION OF SPECIMEN:  N/A  COUNTS:  YES  TOURNIQUET:  * No tourniquets in log *  DICTATION: .Other Dictation: Dictation Number [id number]  PLAN OF CARE: Admit to inpatient   PATIENT DISPOSITION:  PACU - hemodynamically stable.   Delay start of Pharmacological VTE agent (>24hrs) due to surgical blood loss or risk of bleeding: no  [name]. Andrey Campanile, MD, FACS General, Bariatric, & Minimally Invasive Surgery [facility], [place]

## 2012-07-23 NOTE — Anesthesia Preprocedure Evaluation (Addendum)
Anesthesia Evaluation  Patient identified by MRN, date of birth, ID band Patient awake    Reviewed: Allergy & Precautions, H&P , NPO status , Patient's Chart, lab work & pertinent test results  Airway Mallampati: II TM Distance: >3 FB Neck ROM: Full    Dental No notable dental hx.    Pulmonary Current Smoker,  breath sounds clear to auscultation  Pulmonary exam normal       Cardiovascular negative cardio ROS  Rhythm:Regular Rate:Normal  ECG normal.   Neuro/Psych negative neurological ROS  negative psych ROS   GI/Hepatic negative GI ROS, Neg liver ROS,   Endo/Other  negative endocrine ROS  Renal/GU negative Renal ROS  negative genitourinary   Musculoskeletal negative musculoskeletal ROS (+)   Abdominal   Peds negative pediatric ROS (+)  Hematology negative hematology ROS (+)   Anesthesia Other Findings   Reproductive/Obstetrics negative OB ROS                          Anesthesia Physical Anesthesia Plan  ASA: II  Anesthesia Plan: General   Post-op Pain Management:    Induction: Intravenous  Airway Management Planned: Oral ETT  Additional Equipment:   Intra-op Plan:   Post-operative Plan: Extubation in OR  Informed Consent: I have reviewed the patients History and Physical, chart, labs and discussed the procedure including the risks, benefits and alternatives for the proposed anesthesia with the patient or authorized representative who has indicated his/her understanding and acceptance.   Dental advisory given  Plan Discussed with: CRNA  Anesthesia Plan Comments:         Anesthesia Quick Evaluation

## 2012-07-24 ENCOUNTER — Encounter (HOSPITAL_COMMUNITY): Payer: Self-pay | Admitting: General Surgery

## 2012-07-24 LAB — BASIC METABOLIC PANEL
BUN: 9 mg/dL (ref 6–23)
Calcium: 8.8 mg/dL (ref 8.4–10.5)
GFR calc Af Amer: 67 mL/min — ABNORMAL LOW (ref >90)
GFR calc non Af Amer: 57 mL/min — ABNORMAL LOW (ref >90)
Potassium: 5.2 mEq/L — ABNORMAL HIGH (ref 3.5–5.1)
Sodium: 132 mEq/L — ABNORMAL LOW (ref 135–145)

## 2012-07-24 LAB — CBC
MCH: 30.8 pg (ref 26.0–34.0)
MCHC: 33.9 g/dL (ref 30.0–36.0)
Platelets: 226 10*3/uL (ref 150–400)
RBC: 4.58 MIL/uL (ref 3.87–5.11)
RDW: 13.1 % (ref 11.5–15.5)

## 2012-07-24 MED ORDER — VITAMINS A & D EX OINT
TOPICAL_OINTMENT | CUTANEOUS | Status: AC
  Administered 2012-07-24: 1
  Filled 2012-07-24: qty 10

## 2012-07-24 NOTE — Progress Notes (Addendum)
1 Day Post-Op  Subjective: Alert. Oriented. Mental status normal. Looks pretty good. Operative events explained to patient. Pain controlled. Denies respiratory difficulty.  Objective: Vital signs in last 24 hours: Temp:  [97.9 F (36.6 C)-98.7 F (37.1 C)] 98.2 F (36.8 C) (09/06 0223) Pulse Rate:  [57-85] 65  (09/06 0223) Resp:  [11-20] 20  (09/06 0223) BP: (141-199)/(60-125) 157/78 mmHg (09/06 0223) SpO2:  [91 %-100 %] 100 % (09/06 0223) Last BM Date: 07/22/12  Intake/Output from previous day: 09/05 0701 - 09/06 0700 In: 3680 [I.V.:3650; NG/GT:30] Out: 2525 [Urine:2425; Emesis/NG output:50; Blood:50] Intake/Output this shift: Total I/O In: 1280 [I.V.:1250; NG/GT:30] Out: 1500 [Urine:1500]  General appearance: OR. Mental status normal. In no obvious distress. Resp: clear to auscultation bilaterally GI: abdomen with midline incision, looks good. No bleeding. Abdomen reasonably soft, appropriate incisional tenderness, silent.  Lab Results:  Results for orders placed during the hospital encounter of 07/19/12 (from the past 24 hour(s))  SURGICAL PCR SCREEN     Status: Normal   Collection Time   07/23/12  8:19 AM      Component Value Range   MRSA, PCR NEGATIVE  NEGATIVE   Staphylococcus aureus NEGATIVE  NEGATIVE  BASIC METABOLIC PANEL     Status: Abnormal   Collection Time   07/24/12  4:21 AM      Component Value Range   Sodium 132 (*) 135 - 145 mEq/L   Potassium 5.2 (*) 3.5 - 5.1 mEq/L   Chloride 100  96 - 112 mEq/L   CO2 22  19 - 32 mEq/L   Glucose, Bld 143 (*) 70 - 99 mg/dL   BUN 9  6 - 23 mg/dL   Creatinine, Ser 4.78  0.50 - 1.10 mg/dL   Calcium 8.8  8.4 - 29.5 mg/dL   GFR calc non Af Amer 57 (*) >90 mL/min   GFR calc Af Amer 67 (*) >90 mL/min  MAGNESIUM     Status: Normal   Collection Time   07/24/12  4:21 AM      Component Value Range   Magnesium 2.3  1.5 - 2.5 mg/dL  CBC     Status: Normal   Collection Time   07/24/12  4:21 AM      Component Value Range   WBC  5.4  4.0 - 10.5 K/uL   RBC 4.58  3.87 - 5.11 MIL/uL   Hemoglobin 14.1  12.0 - 15.0 g/dL   HCT 62.1  30.8 - 65.7 %   MCV 90.8  78.0 - 100.0 fL   MCH 30.8  26.0 - 34.0 pg   MCHC 33.9  30.0 - 36.0 g/dL   RDW 84.6  96.2 - 95.2 %   Platelets 226  150 - 400 K/uL     Studies/Results: @RISRSLT24 @     . ciprofloxacin  400 mg Intravenous On Call to OR  . dextrose 5 % and 0.45 % NaCl with KCl 20 mEq/L      . enoxaparin (LOVENOX) injection  40 mg Subcutaneous Q24H  . HYDROmorphone      . HYDROmorphone      . labetalol      . morphine   Intravenous Q4H  . pantoprazole (PROTONIX) IV  40 mg Intravenous QHS  . DISCONTD: enoxaparin (LOVENOX) injection  40 mg Subcutaneous Q24H     Assessment/Plan: s/p Procedure(s): LAPAROSCOPY DIAGNOSTIC EXPLORATORY LAPAROTOMY LYSIS OF ADHESION  POD #1. Laparotomy and lysis of adhesions for SBO. Stable. Ileus expected. Mobilize out of bed. Continue NG  suction Repeat lab work tomorrow D/c foley tomorrow, at the latest.    LOS: 5 days    Naavya Postma M. Derrell Lolling, M.D., [facility] General and Minimally invasive Surgery Breast and Colorectal Surgery Office:   [phone] Pager:   [phone]  07/24/2012  . .prob

## 2012-07-24 NOTE — Op Note (Signed)
[name], [name]              ACCOUNT NO.:  [id number]  MEDICAL RECORD NO.:  [id number]  LOCATION:  1540                         FACILITY:  [facility]  PHYSICIAN:  [name]. Andrey Campanile, MD, FACSDATE OF BIRTH:  09-15-1946  DATE OF PROCEDURE:  07/23/2012 DATE OF DISCHARGE:                              OPERATIVE REPORT   PREOPERATIVE DIAGNOSIS:  Partial small bowel obstruction.  POSTOPERATIVE DIAGNOSIS:  Partial small bowel obstruction secondary to closed loop obstruction.  PROCEDURE: 1. Diagnostic laparoscopy. 2. Laparoscopic lysis of adhesions for 20 minutes. 3. Exploratory laparotomy, open lysis of adhesions x40 minutes.  SURGEON:  [name]. Andrey Campanile, MD, FACS.  ASSISTANT:  Romie Levee, MD  ANESTHESIA:  General plus local consisting 0.25% Marcaine with epinephrine.  ESTIMATED BLOOD LOSS:  Minimal.  SPECIMEN:  None.  COMPLICATIONS:  None immediately apparent.  FINDINGS:  The patient had omental adhesions to the anterior abdominal wall, however, her source of obstruction was in her distal small bowel. She had a knuckle of omentum creating a closed loop bowel obstruction. The bowel was viable and therefore, no resection was undertaken.  INDICATIONS FOR PROCEDURE:  The patient is a very pleasant 66 year old Afro-American female, who has had a prior lower midline incision for a left oophorectomy, who came into the hospital on September 1 with nausea, vomiting and some abdominal pain.  She was nontoxic appearing. Medical management was instituted for a partial small bowel obstruction with NG tube decompression and bowel rest.  This was continued for the next several days.  However, the patient remained obstipated, although stable without any worsening signs of disease.  She just simply was not getting any better.  We discussed the pros and cons of ongoing observation with nonsurgical management versus surgical management.  We discussed surgery involving a diagnostic laparoscopy  with lysis of adhesions, possible open exploratory laparotomy with possible bowel resection and possible ostomy.  We discussed the risks and benefits at length, including but not limited to, bleeding, infection, injury to surrounding structures, need for bowel resection, injury to surrounding structures, possible anastomotic stricture, possible anastomotic leakage, incisional hernia, wound infection, prolonged ileus, prolonged hospitalization, blood clot formation, and anesthesia complications. The patient elected to proceed with surgery.  DESCRIPTION OF PROCEDURE:  After obtaining informed consent, the patient was taken to the operating room 11 at [facility].  General endotracheal anesthesia was established.  A Foley catheter was placed. She already had a nasogastric tube in place.  The surgical time-out was performed.  Her abdomen was prepped and draped in the usual standard surgical fashion with ChloraPrep.  The small infraumbilical incision was made through her old incision with a #11 blade after local had been infiltrated.  The fascia was grasped and lifted anteriorly with Kocher's.  The fascia was incised and the abdominal cavity was entered. A pursestring suture was placed around the fascial edges using a 0 Vicryl.  A 12 mm Hasson trocar was placed.  The pneumoperitoneum was smoothly established up to a patient pressure of 15 mmHg.  Upon inserting the laparoscope, visualized the omentum being adherent to the abdominal wall, but I saw that there was a free area in the left upper quadrant.  Therefore,  a 5 mm trocar was placed in the left upper quadrant.  The laparoscope was switched the left upper quadrant and confirmed that there was thin filmy omental adhesions to the anterior abdominal wall both in the lower abdomen and the upper abdomen. Additional 5 mm trocar was placed in the left lateral abdominal wall under direct visualization.  I then took down the thin filmy  adhesions using Endoshears without electrocautery.  The patient was then placed in Trendelenburg.  As I got down into the lower abdomen and the pelvis, the omentum was more densely adhered to the lower abdominal and pelvic wall. In looking laterally in the left pericolic gutter, I visualized what appeared to be small bowel and it appeared that the omentum was somewhat adherent.  Based on the adhesions in the lower abdomen, I felt the safest course of action was to convert to an open procedure at that point.  I excised her old scar in the lower midline sharply with a #10 blade.  Divided the subcutaneous tissue with electrocautery and then opened the fascia with electrocautery.  A Balfour retractor was placed. I then took down the omentum, which was adhered to the right pelvic sidewall with electrocautery.  I also took down the remaining omentum from the lower midline, taking care to ensure that I was not near the bladder.  This freed the omentum and I was able to lift it up out of the pelvis.  We identified a segment of distal small bowel that was hyperemic, and there was still a small knuckle of omentum stuck down to the small bowel that appeared obstructed.  Using a pair of Metzenbaum scissors, I was able to free up this remaining hung of omentum.  This allowed the bowel to become unobstructed.  It appeared that it had been a closed loop obstruction as result of this omental adhesion to the small bowel mesentery.  We ran the bowel proximally and distally.  The distal bowel was completely decompressed.  There was no signs of ischemia.  The bowel that had been kinked, pinked up immediately.  There was no signs of a transit stricture within this segment of bowel.  The proximal bowel was although dilated, did appear normal.  We then irrigated the abdomen with saline.  The small bowel was returned to the abdomen and the omentum was placed on top of the small bowel.  I then closed the fascia  in a running fashion with a #1 looped PDS x2.  We reestablished pneumoperitoneum and placed the laparoscope in the left upper quadrant and visualized the midline closure.  There was nothing in our closure and it was airtight.  Pneumoperitoneum was released.  The 2 remaining trocars were removed.  The subcutaneous skin incision was irrigated.  The skin was reapproximated with skin staples with Telfa wicks, placed between some of the skin staples.  The 2 trocar sites were closed with skin staples.  Sterile dressings were applied.  The patient was extubated taken to the recovery room in stable condition.  All needle, instrument, and sponge counts were correct x2.  There were no immediate complications.  The patient tolerated the procedure well.     [name]. Andrey Campanile, MD, FACS     EMW/MEDQ  D:  07/23/2012  T:  07/24/2012  Job:  [id number]

## 2012-07-25 LAB — CBC
HCT: 39.5 % (ref 36.0–46.0)
Platelets: 238 10*3/uL (ref 150–400)
RDW: 13.2 % (ref 11.5–15.5)
WBC: 6 10*3/uL (ref 4.0–10.5)

## 2012-07-25 LAB — BASIC METABOLIC PANEL
Chloride: 97 mEq/L (ref 96–112)
GFR calc Af Amer: 59 mL/min — ABNORMAL LOW (ref >90)
GFR calc non Af Amer: 51 mL/min — ABNORMAL LOW (ref >90)
Potassium: 4.7 mEq/L (ref 3.5–5.1)
Sodium: 133 mEq/L — ABNORMAL LOW (ref 135–145)

## 2012-07-25 NOTE — Progress Notes (Signed)
Ng has been clamped since 7:40 am. Pt c/o mild nausea. Ng to suction with 100cc green liquid immediate return. States this relieved nausea. Will leave to suction x 30 minutes and clamp again.

## 2012-07-25 NOTE — Progress Notes (Signed)
General Surgery Note  LOS: 6 days  POD# 2 Room - 1540  Assessment/Plan: 1. LAPAROSCOPY DIAGNOSTIC, EXPLORATORY LAPAROTOMY, LYSIS OF ADHESION - E. Wilson - 07/23/2012  Doing well, will clamp NGT.  Continue to ambulate   2. Mild elevated creatinine - 1.11 - 07/25/2012  Foley out  3. DVT proph - Lovenox  Subjective:  Traveling through [place], from [place].  Sister and husband in room.  No flatus or BM, but feels okay.  Some burping.  Objective:   Filed Vitals:   07/25/12 0400  BP:   Pulse:   Temp:   Resp: 20     Intake/Output from previous day:  09/06 0701 - 09/07 0700 In: 2200 [I.V.:2200] Out: 2550 [Urine:2350; Emesis/NG output:200]  Intake/Output this shift:      Physical Exam:   General: WN AA F who is alert and oriented.    HEENT: Normal. Pupils equal. .   Lungs: Clear   Abdomen: Soft, few BS.   Wound: Wicks removed.  Wound clean.  Redressed.   Neurologic:  Grossly intact to motor and sensory function.   Psychiatric: Has normal mood and affect.   Lab Results:    Basename 07/25/12 0447 07/24/12 0421  WBC 6.0 5.4  HGB 13.3 14.1  HCT 39.5 41.6  PLT 238 226    BMET   Basename 07/25/12 0447 07/24/12 0421  NA 133* 132*  K 4.7 5.2*  CL 97 100  CO2 25 22  GLUCOSE 129* 143*  BUN 12 9  CREATININE 1.11* 1.00  CALCIUM 8.9 8.8    PT/INR  No results found for this basename: LABPROT:2,INR:2 in the last 72 hours  ABG  No results found for this basename: PHART:2,PCO2:2,PO2:2,HCO3:2 in the last 72 hours   Studies/Results:  No results found.   Anti-infectives:   Anti-infectives     Start     Dose/Rate Route Frequency Ordered Stop   07/23/12 0830   ciprofloxacin (CIPRO) IVPB 400 mg        400 mg 200 mL/hr over 60 Minutes Intravenous On call to O.R. 07/23/12 0740 07/23/12 1215          Ovidio Kin, MD, FACS Pager: [phone],   [facility] Office: [phone] 07/25/2012

## 2012-07-26 NOTE — Progress Notes (Signed)
General Surgery Note  LOS: 7 days  POD# 3 Room - 1540  Assessment/Plan: 1. LAPAROSCOPY DIAGNOSTIC, EXPLORATORY LAPAROTOMY, LYSIS OF ADHESION - E. Wilson - 07/23/2012  Removed NGT, but will keep NPO.  Continue to ambulate   2. Mild elevated creatinine - 1.11 - 07/25/2012  Recheck labs in AM. 3. DVT proph - Lovenox  Subjective:  Still no flatus or BM, but feels okay.  Traveling through [place], from [place].  Sister in room.  Objective:   Filed Vitals:   07/26/12 0550  BP: 131/74  Pulse: 63  Temp: 97.9 F (36.6 C)  Resp: 16     Intake/Output from previous day:  09/07 0701 - 09/08 0700 In: 3030 [I.V.:3000; NG/GT:30] Out: 2200 [Urine:2200]  Intake/Output this shift:      Physical Exam:   General: WN AA F who is alert and oriented.    HEENT: Normal. Pupils equal. .   Lungs: Clear   Abdomen: Soft, few BS.   Wound: Wicks removed.  Wound clean.  Redressed.   Neurologic:  Grossly intact to motor and sensory function.   Psychiatric: Has normal mood and affect.   Lab Results:     Basename 07/25/12 0447 07/24/12 0421  WBC 6.0 5.4  HGB 13.3 14.1  HCT 39.5 41.6  PLT 238 226    BMET    Basename 07/25/12 0447 07/24/12 0421  NA 133* 132*  K 4.7 5.2*  CL 97 100  CO2 25 22  GLUCOSE 129* 143*  BUN 12 9  CREATININE 1.11* 1.00  CALCIUM 8.9 8.8    PT/INR  No results found for this basename: LABPROT:2,INR:2 in the last 72 hours  ABG  No results found for this basename: PHART:2,PCO2:2,PO2:2,HCO3:2 in the last 72 hours   Studies/Results:  No results found.   Anti-infectives:   Anti-infectives     Start     Dose/Rate Route Frequency Ordered Stop   07/23/12 0830   ciprofloxacin (CIPRO) IVPB 400 mg        400 mg 200 mL/hr over 60 Minutes Intravenous On call to O.R. 07/23/12 0740 07/23/12 1215          Ovidio Kin, MD, FACS Pager: [phone],   [facility]: [phone] 07/26/2012

## 2012-07-27 LAB — BASIC METABOLIC PANEL
BUN: 8 mg/dL (ref 6–23)
Calcium: 8.8 mg/dL (ref 8.4–10.5)
Creatinine, Ser: 0.97 mg/dL (ref 0.50–1.10)
GFR calc Af Amer: 69 mL/min — ABNORMAL LOW (ref >90)
GFR calc non Af Amer: 60 mL/min — ABNORMAL LOW (ref >90)
Glucose, Bld: 132 mg/dL — ABNORMAL HIGH (ref 70–99)
Potassium: 3.9 mEq/L (ref 3.5–5.1)

## 2012-07-27 NOTE — Progress Notes (Signed)
Agree with above 

## 2012-07-27 NOTE — Progress Notes (Signed)
INITIAL ADULT NUTRITION ASSESSMENT Date: 07/27/2012   Time: 4:45 PM Reason for Assessment: NPO x 8 days  INTERVENTION: Diet advancement per MD. If diet unable to be advanced in next 1-2 days, recommend MD consider enteral nutrition. Will monitor.   ASSESSMENT: Female 66 y.o.  Dx: Small bowel obstruction  Food/Nutrition Related Hx: Pt admitted with 2 day history of vomiting with severe abdominal pain which was thought to have been from food poisoning. Pt ate fried shrimp, salad, and chicken that was pink in the middle. Pt without food intake since Friday PTA. Pt had NGT placed on 9/2 that got out tan to clear secretion once placed. Pt found to have partial small bowel obstruction and had exploratory laparotomy with lysis of adhesions that took 1 hour on 9/5. NGT removed yesterday. Met with pt who reports prior to getting sick she was eating well, 2 meals/day of healthy meals as she and her husband were trying to lose weight. Pt states she and her husband walked 2.5 miles three times/week. Pt reports 6-8 pound intentional weight loss since April of this year r/t eating healthier and increasing physical activity. Pt reports she does not have any nausea currently and feels as if her bowels are waking up.   Hx:  History reviewed. No pertinent past medical history.  Related Meds:  Scheduled Meds:   . enoxaparin (LOVENOX) injection  40 mg Subcutaneous Q24H  . morphine   Intravenous Q4H  . pantoprazole (PROTONIX) IV  40 mg Intravenous QHS   Continuous Infusions:   . dextrose 5 % and 0.45 % NaCl with KCl 20 mEq/L 125 mL/hr at 07/27/12 1438   PRN Meds:.diphenhydrAMINE, diphenhydrAMINE, menthol-cetylpyridinium, metoprolol, naloxone, ondansetron, phenol, sodium chloride  Ht: 5\' 7"  (170.2 cm)  Wt: 180 lb 3.2 oz (81.738 kg)  Ideal Wt: 135 lb % Ideal Wt: 133  Usual Wt: 182 lb per pt report  % Usual Wt: 99  Body mass index is 28.22 kg/(m^2).   Labs:  CMP     Component Value  Date/Time   NA 135 07/27/2012 0400   K 3.9 07/27/2012 0400   CL 101 07/27/2012 0400   CO2 25 07/27/2012 0400   GLUCOSE 132* 07/27/2012 0400   BUN 8 07/27/2012 0400   CREATININE 0.97 07/27/2012 0400   CALCIUM 8.8 07/27/2012 0400   PROT 7.8 07/19/2012 2000   ALBUMIN 4.4 07/19/2012 2000   AST 21 07/19/2012 2000   ALT 11 07/19/2012 2000   ALKPHOS 85 07/19/2012 2000   BILITOT 0.5 07/19/2012 2000   GFRNONAA 60* 07/27/2012 0400   GFRAA 69* 07/27/2012 0400    Intake/Output Summary (Last 24 hours) at 07/27/12 1723 Last data filed at 07/27/12 1400  Gross per 24 hour  Intake   2000 ml  Output   1700 ml  Net    300 ml   Last BM - 9/3  Diet Order: NPO   IVF:    dextrose 5 % and 0.45 % NaCl with KCl 20 mEq/L Last Rate: 125 mL/hr at 07/27/12 1438    Estimated Nutritional Needs:   Kcal: 1700-2000 Protein: 90-110g  Fluid: 1.7-2L  NUTRITION DIAGNOSIS: -Inadequate oral intake (NI-2.1).  Status: Ongoing  RELATED TO: awaiting return of bowel function  AS EVIDENCE BY: NPO  MONITORING/EVALUATION(Goals): Advance diet as tolerated to low fiber diet.   EDUCATION NEEDS: -Education needs addressed - discussed low fiber diet therapy r/t recent intestinal surgery and provided handout of this information.    Dietitian #: [phone]  DOCUMENTATION  CODES Per approved criteria  -Not Applicable    Marshall Cork 07/27/2012, 4:45 PM

## 2012-07-27 NOTE — Progress Notes (Signed)
Patient ID: [name], female   DOB: 05/12/1946, 66 y.o.   MRN: [id number] 4 Days Post-Op  Subjective: Pt feels ok.  Still no flatus, but no nausea with NGT out.  Objective: Vital signs in last 24 hours: Temp:  [97.9 F (36.6 C)-98.3 F (36.8 C)] 97.9 F (36.6 C) (09/09 0515) Pulse Rate:  [59-72] 59  (09/09 0515) Resp:  [14-22] 18  (09/09 0800) BP: (125-144)/(72-73) 144/72 mmHg (09/09 0515) SpO2:  [99 %-100 %] 100 % (09/09 0800) Last BM Date: 07/21/12  Intake/Output from previous day: 09/08 0701 - 09/09 0700 In: 3000 [I.V.:3000] Out: 800 [Urine:800] Intake/Output this shift: Total I/O In: -  Out: 700 [Urine:700]  PE: Abd: soft, -BS, minimal distention, appropriately tender. Incision c/d/i with staples.  Lab Results:   [facility] 07/25/12 0447  WBC 6.0  HGB 13.3  HCT 39.5  PLT 238   BMET  Basename 07/27/12 0400 07/25/12 0447  NA 135 133*  K 3.9 4.7  CL 101 97  CO2 25 25  GLUCOSE 132* 129*  BUN 8 12  CREATININE 0.97 1.11*  CALCIUM 8.8 8.9   PT/INR No results found for this basename: LABPROT:2,INR:2 in the last 72 hours CMP     Component Value Date/Time   NA 135 07/27/2012 0400   K 3.9 07/27/2012 0400   CL 101 07/27/2012 0400   CO2 25 07/27/2012 0400   GLUCOSE 132* 07/27/2012 0400   BUN 8 07/27/2012 0400   CREATININE 0.97 07/27/2012 0400   CALCIUM 8.8 07/27/2012 0400   PROT 7.8 07/19/2012 2000   ALBUMIN 4.4 07/19/2012 2000   AST 21 07/19/2012 2000   ALT 11 07/19/2012 2000   ALKPHOS 85 07/19/2012 2000   BILITOT 0.5 07/19/2012 2000   GFRNONAA 60* 07/27/2012 0400   GFRAA 69* 07/27/2012 0400   Lipase     Component Value Date/Time   LIPASE 34 07/19/2012 2000       Studies/Results: No results found.  Anti-infectives: Anti-infectives     Start     Dose/Rate Route Frequency Ordered Stop   07/23/12 0830   ciprofloxacin (CIPRO) IVPB 400 mg        400 mg 200 mL/hr over 60 Minutes Intravenous On call to O.R. 07/23/12 0740 07/23/12 1215           Assessment/Plan  1. S/p  ex lap with LOA 2. Post op ileus  Plan: 1. Await return of bowel function, cont NPO X ice and sips for now 2. Cont pulm toilet and mobilization.   LOS: 8 days    Vivyan Biggers E 07/27/2012

## 2012-07-28 MED ORDER — OXYCODONE-ACETAMINOPHEN 5-325 MG PO TABS
1.0000 | ORAL_TABLET | ORAL | Status: DC | PRN
  Administered 2012-07-29: 1 via ORAL
  Filled 2012-07-28: qty 1

## 2012-07-28 MED ORDER — MORPHINE SULFATE 2 MG/ML IJ SOLN: 2.0000 mg | INTRAMUSCULAR | Status: DC | PRN

## 2012-07-28 NOTE — Progress Notes (Signed)
5 Days Post-Op  Subjective: Passing flatus, no n/v no complaints  Objective: Vital signs in last 24 hours: Temp:  [97.9 F (36.6 C)-98.6 F (37 C)] 97.9 F (36.6 C) (09/10 0612) Pulse Rate:  [57-74] 74  (09/10 0612) Resp:  [12-19] 14  (09/10 0800) BP: (110-154)/(60-70) 110/70 mmHg (09/10 0612) SpO2:  [95 %-100 %] 100 % (09/10 0800) FiO2 (%):  [27 %-42 %] 42 % (09/10 0417) Last BM Date: 07/21/12  Intake/Output from previous day: 09/09 0701 - 09/10 0700 In: 3031.3 [I.V.:3031.3] Out: 3250 [Urine:3250] Intake/Output this shift:    General appearance: no distress GI: soft, wound clean, bs are present, approp tender   Assessment/Plan: POD 5 ex lap/loa  LOS: 9 days  1. Dc pca, iv pain meds and po 2. pulm toilet 3. Clears today, appears ileus resolving 4. Sq heparin, scds   Tangi Shroff 07/28/2012

## 2012-07-29 LAB — BASIC METABOLIC PANEL
BUN: 6 mg/dL (ref 6–23)
Calcium: 9.3 mg/dL (ref 8.4–10.5)
Chloride: 101 mEq/L (ref 96–112)
Creatinine, Ser: 1 mg/dL (ref 0.50–1.10)
GFR calc Af Amer: 67 mL/min — ABNORMAL LOW (ref >90)
GFR calc non Af Amer: 57 mL/min — ABNORMAL LOW (ref >90)

## 2012-07-29 MED ORDER — INFLUENZA VIRUS VACC SPLIT PF IM SUSP
0.5000 mL | INTRAMUSCULAR | Status: AC
  Administered 2012-07-30: 0.5 mL via INTRAMUSCULAR
  Filled 2012-07-29: qty 0.5

## 2012-07-29 NOTE — Progress Notes (Signed)
Agree with above 

## 2012-07-29 NOTE — Progress Notes (Signed)
Patient ID: [name], female   DOB: 1946-09-20, 66 y.o.   MRN: [id number] 6 Days Post-Op  Subjective: Pt feels great this morning.  Tolerating clears well.  Wants more to eat.  Objective: Vital signs in last 24 hours: Temp:  [97.7 F (36.5 C)-98 F (36.7 C)] 97.7 F (36.5 C) (09/11 0625) Pulse Rate:  [56-66] 58  (09/11 0625) Resp:  [16] 16  (09/11 0625) BP: (133-136)/(70-79) 134/70 mmHg (09/11 0625) SpO2:  [99 %] 99 % (09/11 0625) Last BM Date: 07/28/12  Intake/Output from previous day: 09/10 0701 - 09/11 0700 In: 3299.6 [P.O.:1320; I.V.:1979.6] Out: 2400 [Urine:2400] Intake/Output this shift:    PE: Abd: soft, minimally tender, +BS, Nd, incision c/d/i with staples  Lab Results:  No results found for this basename: WBC:2,HGB:2,HCT:2,PLT:2 in the last 72 hours BMET  Basename 07/29/12 0424 07/27/12 0400  NA 135 135  K 4.1 3.9  CL 101 101  CO2 24 25  GLUCOSE 127* 132*  BUN 6 8  CREATININE 1.00 0.97  CALCIUM 9.3 8.8   PT/INR No results found for this basename: LABPROT:2,INR:2 in the last 72 hours CMP     Component Value Date/Time   NA 135 07/29/2012 0424   K 4.1 07/29/2012 0424   CL 101 07/29/2012 0424   CO2 24 07/29/2012 0424   GLUCOSE 127* 07/29/2012 0424   BUN 6 07/29/2012 0424   CREATININE 1.00 07/29/2012 0424   CALCIUM 9.3 07/29/2012 0424   PROT 7.8 07/19/2012 2000   ALBUMIN 4.4 07/19/2012 2000   AST 21 07/19/2012 2000   ALT 11 07/19/2012 2000   ALKPHOS 85 07/19/2012 2000   BILITOT 0.5 07/19/2012 2000   GFRNONAA 57* 07/29/2012 0424   GFRAA 67* 07/29/2012 0424   Lipase     Component Value Date/Time   LIPASE 34 07/19/2012 2000       Studies/Results: No results found.  Anti-infectives: Anti-infectives     Start     Dose/Rate Route Frequency Ordered Stop   07/23/12 0830   ciprofloxacin (CIPRO) IVPB 400 mg        400 mg 200 mL/hr over 60 Minutes Intravenous On call to O.R. 07/23/12 0740 07/23/12 1215           Assessment/Plan  1. S/p ex lap with LOA 2.  Post op ileus  Plan: 1. Advance to fulls today, regular diet tomorrow. 2. If tolerates that all ok will look at possible dc home tomorrow.   LOS: 10 days    Kearston Putman E 07/29/2012

## 2012-07-30 MED ORDER — OXYCODONE-ACETAMINOPHEN 5-325 MG PO TABS: 1.0000 | ORAL_TABLET | ORAL | Status: AC | PRN

## 2012-07-30 NOTE — Progress Notes (Signed)
Patient ID: [name], female   DOB: 1946-01-10, 66 y.o.   MRN: [id number] 7 Days Post-Op  Subjective: Pt feels well.  Tolerating a solid food diet.  Objective: Vital signs in last 24 hours: Temp:  [97.9 F (36.6 C)-98.2 F (36.8 C)] 97.9 F (36.6 C) (09/12 0852) Pulse Rate:  [54-68] 59  (09/12 0852) Resp:  [18] 18  (09/12 0600) BP: (133-157)/(53-76) 133/76 mmHg (09/12 0852) SpO2:  [100 %] 100 % (09/12 0600) Last BM Date: 07/28/12  Intake/Output from previous day: 09/11 0701 - 09/12 0700 In: 972.5 [P.O.:720; I.V.:252.5] Out: 400 [Urine:400] Intake/Output this shift: Total I/O In: -  Out: 450 [Urine:450]  PE: Abd: soft, incision c/d/i with staples present, +BS, ND  Lab Results:  No results found for this basename: WBC:2,HGB:2,HCT:2,PLT:2 in the last 72 hours BMET  Basename 07/29/12 0424  NA 135  K 4.1  CL 101  CO2 24  GLUCOSE 127*  BUN 6  CREATININE 1.00  CALCIUM 9.3   PT/INR No results found for this basename: LABPROT:2,INR:2 in the last 72 hours CMP     Component Value Date/Time   NA 135 07/29/2012 0424   K 4.1 07/29/2012 0424   CL 101 07/29/2012 0424   CO2 24 07/29/2012 0424   GLUCOSE 127* 07/29/2012 0424   BUN 6 07/29/2012 0424   CREATININE 1.00 07/29/2012 0424   CALCIUM 9.3 07/29/2012 0424   PROT 7.8 07/19/2012 2000   ALBUMIN 4.4 07/19/2012 2000   AST 21 07/19/2012 2000   ALT 11 07/19/2012 2000   ALKPHOS 85 07/19/2012 2000   BILITOT 0.5 07/19/2012 2000   GFRNONAA 57* 07/29/2012 0424   GFRAA 67* 07/29/2012 0424   Lipase     Component Value Date/Time   LIPASE 34 07/19/2012 2000       Studies/Results: No results found.  Anti-infectives: Anti-infectives     Start     Dose/Rate Route Frequency Ordered Stop   07/23/12 0830   ciprofloxacin (CIPRO) IVPB 400 mg        400 mg 200 mL/hr over 60 Minutes Intravenous On call to O.R. 07/23/12 0740 07/23/12 1215           Assessment/Plan  1. S/p ex lap with LOA 2. Post op ileus, resolved  Plan: 1. Ok to [employer]  home if tolerates solid food this am. 2. She will follow up early next week for staple removal, and then likely follow up with her PCP or general surgeon in [place], [place] where she is from.   LOS: 11 days    Reedy Biernat E 07/30/2012

## 2012-07-30 NOTE — Discharge Summary (Signed)
Patient ID: [name] MRN: [id number] DOB/AGE: June 20, 1946 66 y.o.  Admit date: 07/19/2012 Discharge date: 07/30/2012  Procedures: Diagnostic laparoscopy converted to exploratory laparotomy with lysis of adhesions  Consults: None  Reason for Admission: patient is a 66 year old black female from [place], [place], traveling through [place] on vacation. The patient developed lower abdominal pain followed by onset of nausea and vomiting 3 days ago. She suspected food poisoning. However she denies fevers or chills. She has had no bowel movements over the past 2 days. She denies passing flatus. With persistent nausea and vomiting, the patient presented to the emergency department for evaluation. CT scan of the abdomen and pelvis shows evidence of small bowel up structure likely secondary to adhesions.  Admission Diagnoses:  1. SBO, likely secondary to adhesions  Hospital Course: The patient was admitted and an NGT was placed for decompression and conservative management.  She had an NGT for 2 days, but her output actually began to increase.  Her x-rays did not show much improvement and it was felt at that time, she would not improve without surgical intervention.  We proceeded to the OR.  She started with a dx laparoscopy and had a LOA, but required an open procedure for more extensive lysis of adhesions.  The patient tolerated this procedure well.  Post operatively, she continued with her NGT while we awaited her ileus to resolved and her bowel function to return.  Her NGT was clamped on POD # 2.  It was removed on day 3, but she remained NPO until POD # 4.  She began to pass flatus and she was able to have clear liquids.  Her diet was then advanced as tolerated.  Her staples were left in place at time of dc on POD# 7 and she will return to our office prior to going back to [place], for staple removal.  Discharge Diagnoses:  Active Problems:  SBO (small bowel obstruction) s/p dx laparoscopy with  conversion to laparotomy with LOA  Discharge Medications:   Medication List     As of 07/30/2012  9:07 AM    TAKE these medications         oxyCODONE-acetaminophen 5-325 MG per tablet   Commonly known as: PERCOCET/ROXICET   Take 1-2 tablets by mouth every 4 (four) hours as needed.        Discharge Instructions:     Follow-up Information    Follow up with Liliana Cline, CMA. On 08/04/2012. (9:30am  for staple removal appointment with Dr. Tawana Scale nurse)    Contact information:   [address] [place] [address] [phone]      Follow up with PCP: Dr. Tyna Jaksch.   Currently awaiting call back to set up PCP follow up with him versus referral to general surgeon in [place]      Signed: Ziare Cryder E 07/30/2012, 9:07 AM

## 2012-07-30 NOTE — Discharge Summary (Signed)
Agree 

## 2012-07-30 NOTE — Progress Notes (Signed)
Agree with above 

## 2012-08-04 ENCOUNTER — Encounter (INDEPENDENT_AMBULATORY_CARE_PROVIDER_SITE_OTHER): Payer: Self-pay | Admitting: General Surgery

## 2012-08-04 ENCOUNTER — Ambulatory Visit (INDEPENDENT_AMBULATORY_CARE_PROVIDER_SITE_OTHER): Payer: PRIVATE HEALTH INSURANCE | Admitting: General Surgery

## 2012-08-04 VITALS — BP 150/70 | HR 65 | Temp 97.8°F | Ht 67.0 in | Wt 176.6 lb

## 2012-08-04 DIAGNOSIS — Z4802 Encounter for removal of sutures: Secondary | ICD-10-CM

## 2012-08-04 NOTE — Progress Notes (Signed)
Patient comes in today S/P laparoscopy on 07/23/12 by Dr.Wilson for staple removal...incision healing well, area clean and dry with no signs of infection...staples were removed intact and steri-strips and dry dressing were placed...2nd opinion asked from Pattricia Boss to make sure everything looked okay before proceeding..patient tolerated well and has follow up appt with Dr. Andrey Campanile on 08/19/12 at 8:45.Marland KitchenMarland Kitchenpatient was instructed to call the office if she had any other questions or concerns and patient was ok with all instructions.Marland Kitchen

## 2012-08-12 ENCOUNTER — Telehealth (INDEPENDENT_AMBULATORY_CARE_PROVIDER_SITE_OTHER): Payer: Self-pay | Admitting: General Surgery

## 2012-08-12 NOTE — Telephone Encounter (Signed)
Pt called to report that since staples were removed a week ago, a small area at the end of the incision is now draining "reddish fluid."  The area is slightly reddened, sore and today pt is running low-grade fever (99.7 this afternoon.)  Her appt is on 08/18/12 at 8:45 am.  Advised pt to take Tylenol to control the fever, taking into consideration the Acetaminiphen in the Percocet.  Also, push po fluids AMAP.  Wash incision with soap and water, rinse, pat dry and cover area draining with gauze for absorption.  Will update Dr. Andrey Campanile on all.  Pt is currently home in [place] at this time.  Pt will call back if fever worsens or other symptoms exacerbate.

## 2012-08-13 NOTE — Telephone Encounter (Signed)
Sounds like a plan

## 2012-08-19 ENCOUNTER — Encounter (INDEPENDENT_AMBULATORY_CARE_PROVIDER_SITE_OTHER): Payer: Self-pay | Admitting: General Surgery

## 2012-08-19 ENCOUNTER — Ambulatory Visit (INDEPENDENT_AMBULATORY_CARE_PROVIDER_SITE_OTHER): Payer: PRIVATE HEALTH INSURANCE | Admitting: General Surgery

## 2012-08-19 ENCOUNTER — Telehealth (INDEPENDENT_AMBULATORY_CARE_PROVIDER_SITE_OTHER): Payer: Self-pay | Admitting: General Surgery

## 2012-08-19 VITALS — BP 138/88 | HR 72 | Temp 97.0°F | Resp 16 | Ht 67.0 in | Wt 178.0 lb

## 2012-08-19 DIAGNOSIS — Z09 Encounter for follow-up examination after completed treatment for conditions other than malignant neoplasm: Secondary | ICD-10-CM

## 2012-08-19 NOTE — Progress Notes (Signed)
Subjective:     Patient ID: [name], female   DOB: [birth year], 66 y.o.   MRN: [id number]  HPI 66 year old [place] American female comes in for her first official postop visit. She was in the hospital from September 1 through September 12. She was admitted for a partial small bowel obstruction. She was initially managed medically with bowel rest and nasogastric tube decompression. However she remained obstipated without resolution of her obstruction. Therefore she underwent diagnostic laparoscopy converted to open exploratory laparotomy with lysis of adhesions. Her postoperative course was unremarkable. She underwent surgery on September 5. She was discharged from the hospital on the  12th. She came into the office about a week and a half ago for staple removal. She denies any fever, chills, nausea, vomiting, diarrhea or constipation. She reports a good appetite. She has had a small amount of intermittent drainage from the lower portion of her incision. It is nonpurulent. She denies any abdominal pain.  Review of Systems     Objective:   Physical Exam BP 138/88  Pulse 72  Temp 97 F (36.1 C) (Temporal)  Resp 16  Ht 5\' 7"  (1.702 m)  Wt 178 lb (80.74 kg)  BMI 27.88 kg/m2  Gen: alert, NAD, non-toxic appearing Pupils: equal, no scleral icterus Pulm: Lungs clear to auscultation, symmetric chest rise CV: regular rate and rhythm Abd: soft, nontender, nondistended. Well-healed lower midline incision -some hypertrophy to scar. Probable subcu hematoma in midportion. No cellulitis. No incisional hernia.  Ext: no edema, no calf tenderness Skin: no rash, no jaundice     Assessment:     S/p diagnostic laparoscopy converted to exploratory laparotomy with lysis of adhesions for closed loop small bowel obstruction    Plan:     She appears to be doing quite well. I encouraged her to continue to refrain from smoking. She hasn't had a cigarette for one month. I told her she could resume full  activities in another 2 weeks. In the interim she can do light cardiovascular activity. I advised her to keep a gauze over the lower portion of her incision as it appears to be rubbing against her pants line. followup as needed. Her PCP is Dr Tyna Jaksch [phone]  [name]. Andrey Campanile, MD, FACS General, Bariatric, & Minimally Invasive Surgery [facility], [place]

## 2012-08-19 NOTE — Patient Instructions (Signed)
Can resume full activities in 2 weeks 

## 2012-08-19 NOTE — Telephone Encounter (Signed)
Office note from Dr Andrey Campanile 08/19/2012 faxed to primary MD - Dr Tyna Jaksch.

## 2012-09-23 ENCOUNTER — Telehealth (INDEPENDENT_AMBULATORY_CARE_PROVIDER_SITE_OTHER): Payer: Self-pay | Admitting: General Surgery

## 2012-09-23 NOTE — Telephone Encounter (Signed)
Patient called requesting all her medical records be faxed to [phone]. This has been faxed in the past but they did not receive them per patient. These were refaxed x5 from multiple fax machines because they stated they were not receiving them. A copy of records also mailed to the patient.

## 2013-08-19 IMAGING — CT CT ABD-PELV W/ CM
1 of 3 series · 14 of 32 positions shown, 19 images · IV contrast (100 ML OMNI 300)
Comparison: None

CLINICAL DATA: Lower abdominal pain, nausea, vomiting, abnormal
outside radiographs demonstrating bowel dilatation and question
free air; WBC = 11.9 K

CT ABDOMEN AND PELVIS WITH CONTRAST
TECHNIQUE: Multidetector CT imaging of the abdomen and pelvis was
performed following the standard protocol during bolus
administration of intravenous contrast. Sagittal and coronal MPR
images reconstructed from axial data set.
Contrast: 100mL OMNIPAQUE IOHEXOL 300 MG/ML  SOLN Dilute oral
contrast.

[Series 2: abd/pel with · axial · 0.67mm/px · z∈[-313,+57]mm · 14 of 84 slices shown, 19 images]
[im 5/84  soft-tissue]
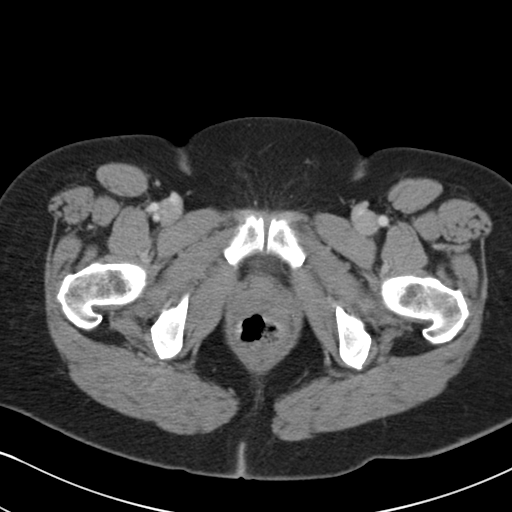
[im 5/84  bone]
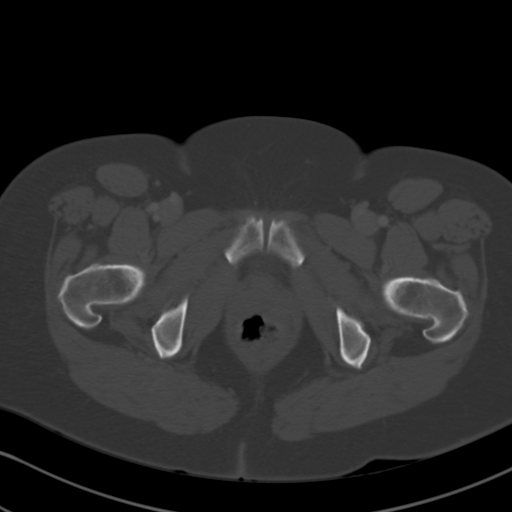
[im 13/84  soft-tissue]
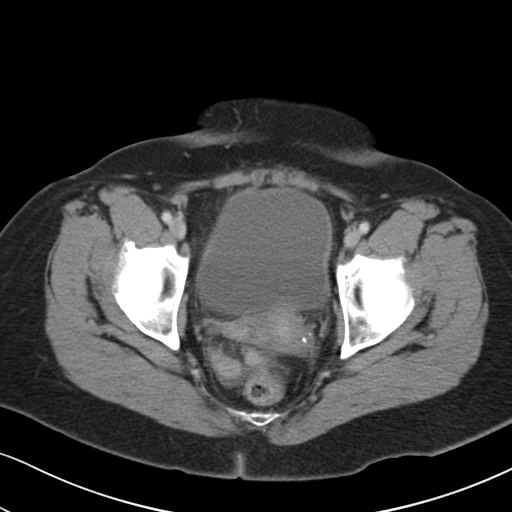
[im 17/84  soft-tissue]
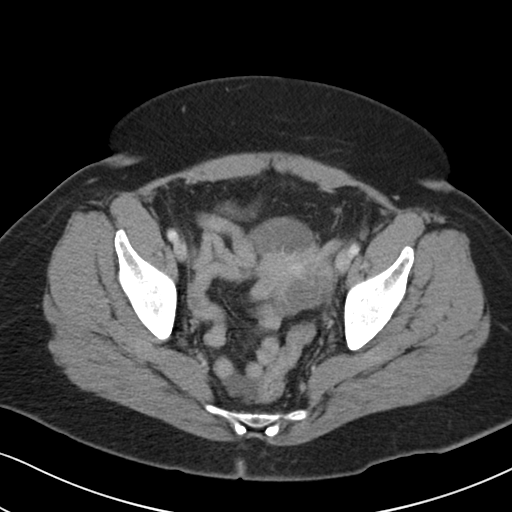
[im 25/84  soft-tissue]
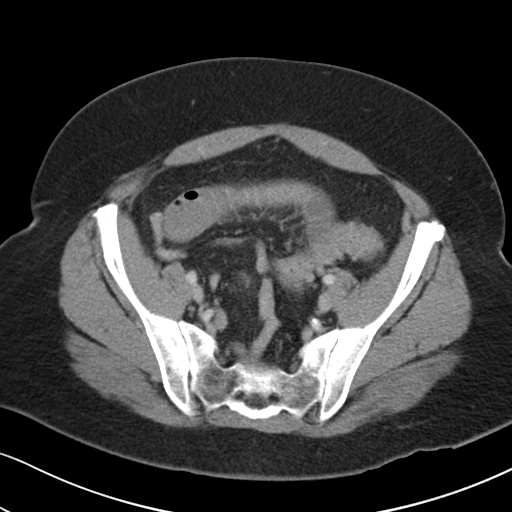
[im 30/84  soft-tissue]
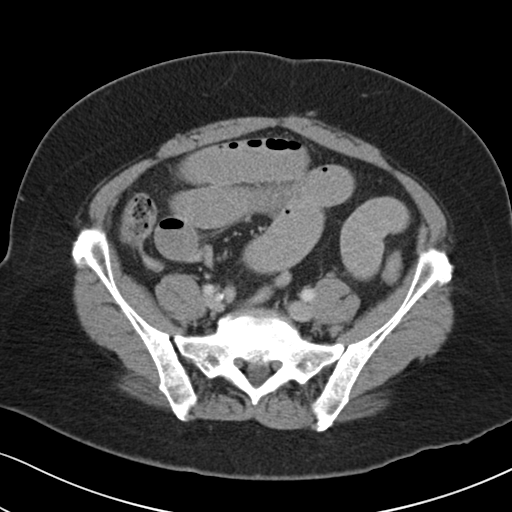
[im 38/84  soft-tissue]
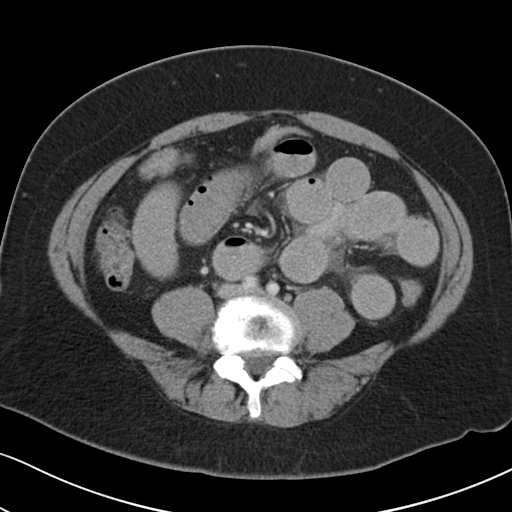
[im 42/84  soft-tissue]
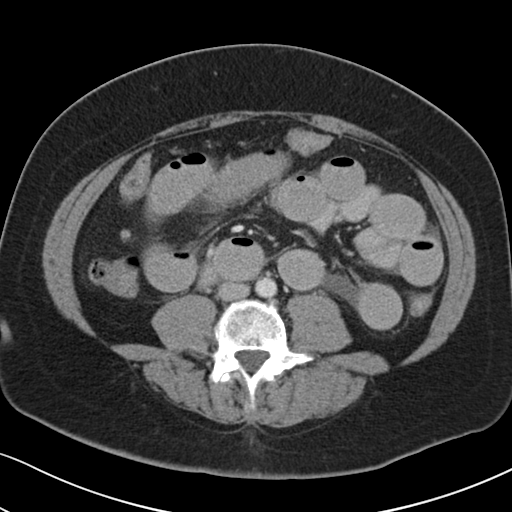
[im 46/84  soft-tissue]
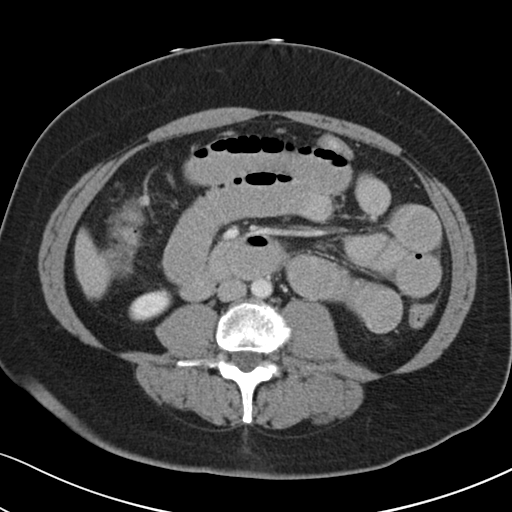
[im 54/84  soft-tissue]
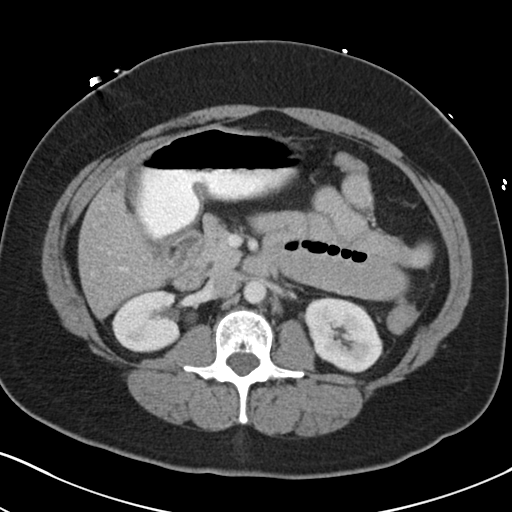
[im 54/84  bone]
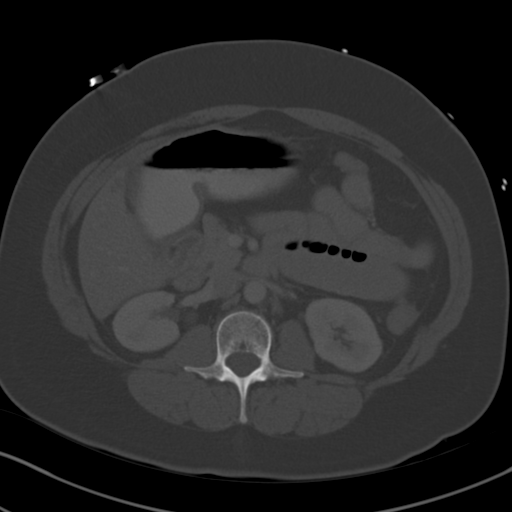
[im 59/84  soft-tissue]
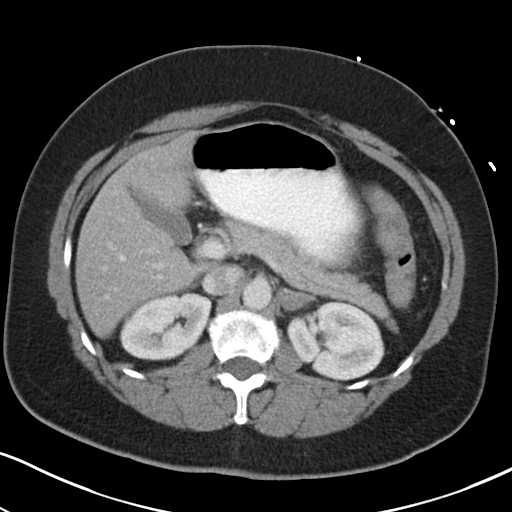
[im 67/84  soft-tissue]
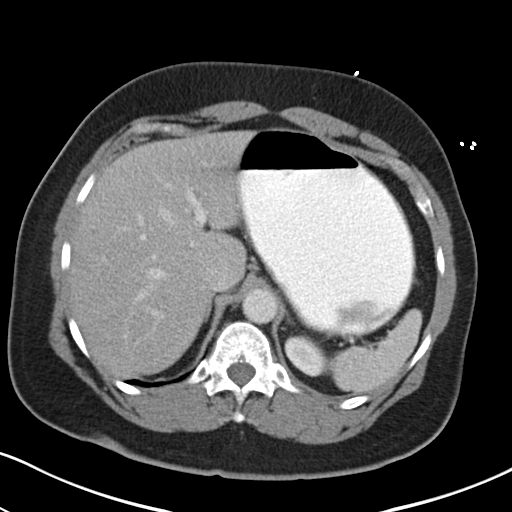
[im 67/84  lung]
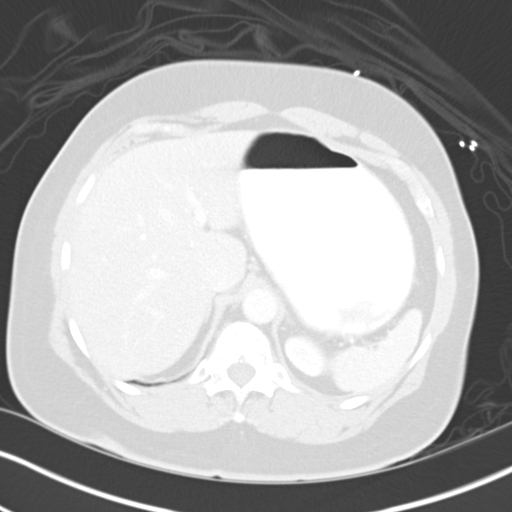
[im 71/84  soft-tissue]
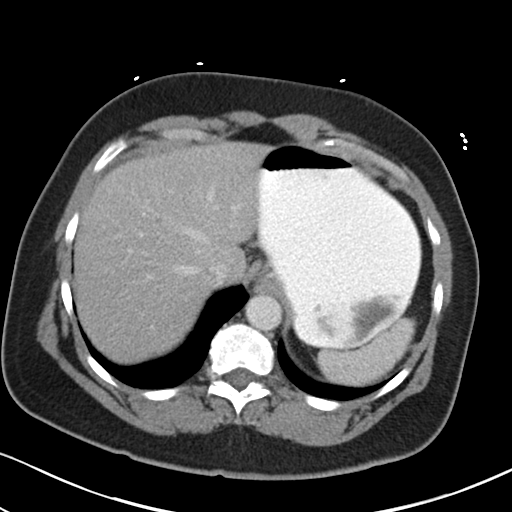
[im 71/84  lung]
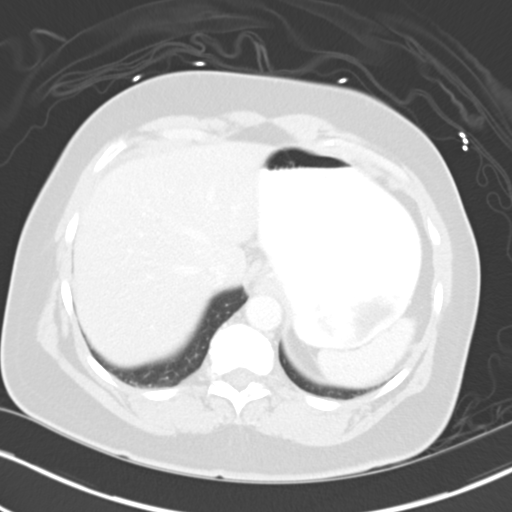
[im 75/84  lung]
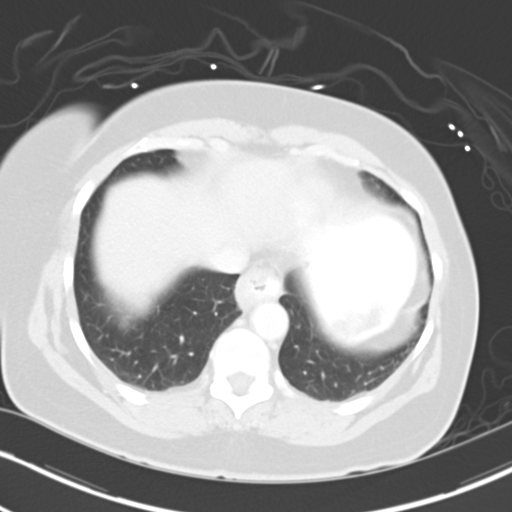
[im 79/84  soft-tissue]
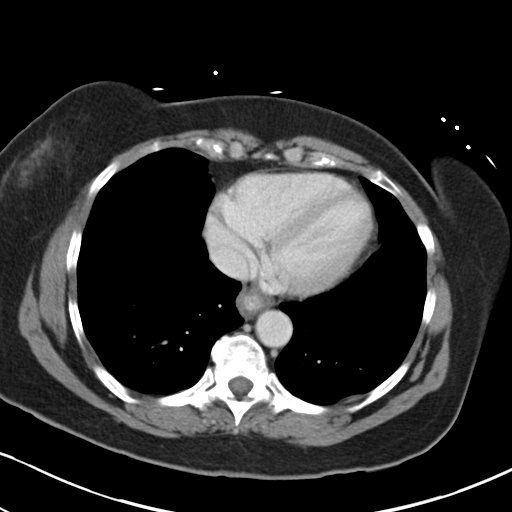
[im 79/84  lung]
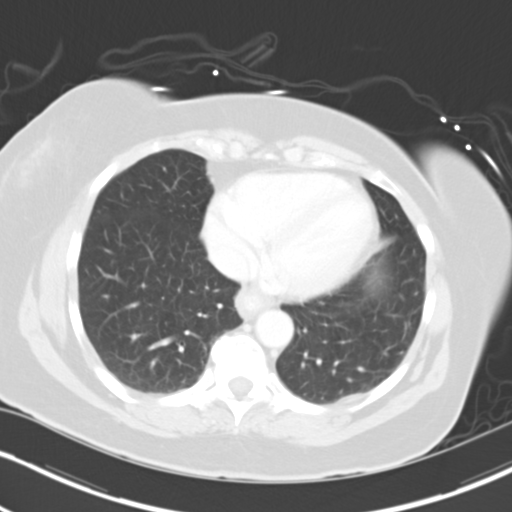

[14 of 32 positions shown; findings below may reference images not displayed]

FINDINGS: Lung bases clear.
Small cyst within liver, largest right lobe 12 x 9 mm image 32.
Liver, spleen, pancreas, kidneys, and right adrenal gland otherwise
normal appearance.
Left adrenal nodule 2.1 x 1.3 cm image 27, showing significant
washout of contrast on delayed images, question small adenoma.

Mildly distended stomach with dilated proximal and decompressed
distal small bowel loops compatible with small bowel obstruction.
Transition zone from dilated to nondilated small bowel occurs in
the left pelvis.
Appendix surgically absent by history.
Small amount of nonspecific free pelvic fluid.
Unremarkable bladder and uterus.
Minimal diverticulosis of sigmoid colon without evidence of
diverticulitis.
No mass, adenopathy, free air, or hernia.
No acute osseous findings.
IMPRESSION: Small bowel obstruction with transition from dilated to nondilated
small bowel occurring in the left pelvis, question adhesion.
Small amount free pelvic fluid is seen but no definite bowel wall
thickening or free intraperitoneal air identified.
Small left adrenal nodule 2.1 x 1.3 cm question adenoma.
Sigmoid diverticulosis.

## 2013-08-20 IMAGING — CR DG ABD PORTABLE 2V
1 series · 3 of 3 positions shown · non-contrast
Comparison: 07/20/2012 CT abdomen and pelvis

CLINICAL DATA: Small bowel obstruction

PORTABLE ABDOMEN - 2 VIEW

[Series 1: AP · U · 3 of 3 slices shown]
[im 1/3]
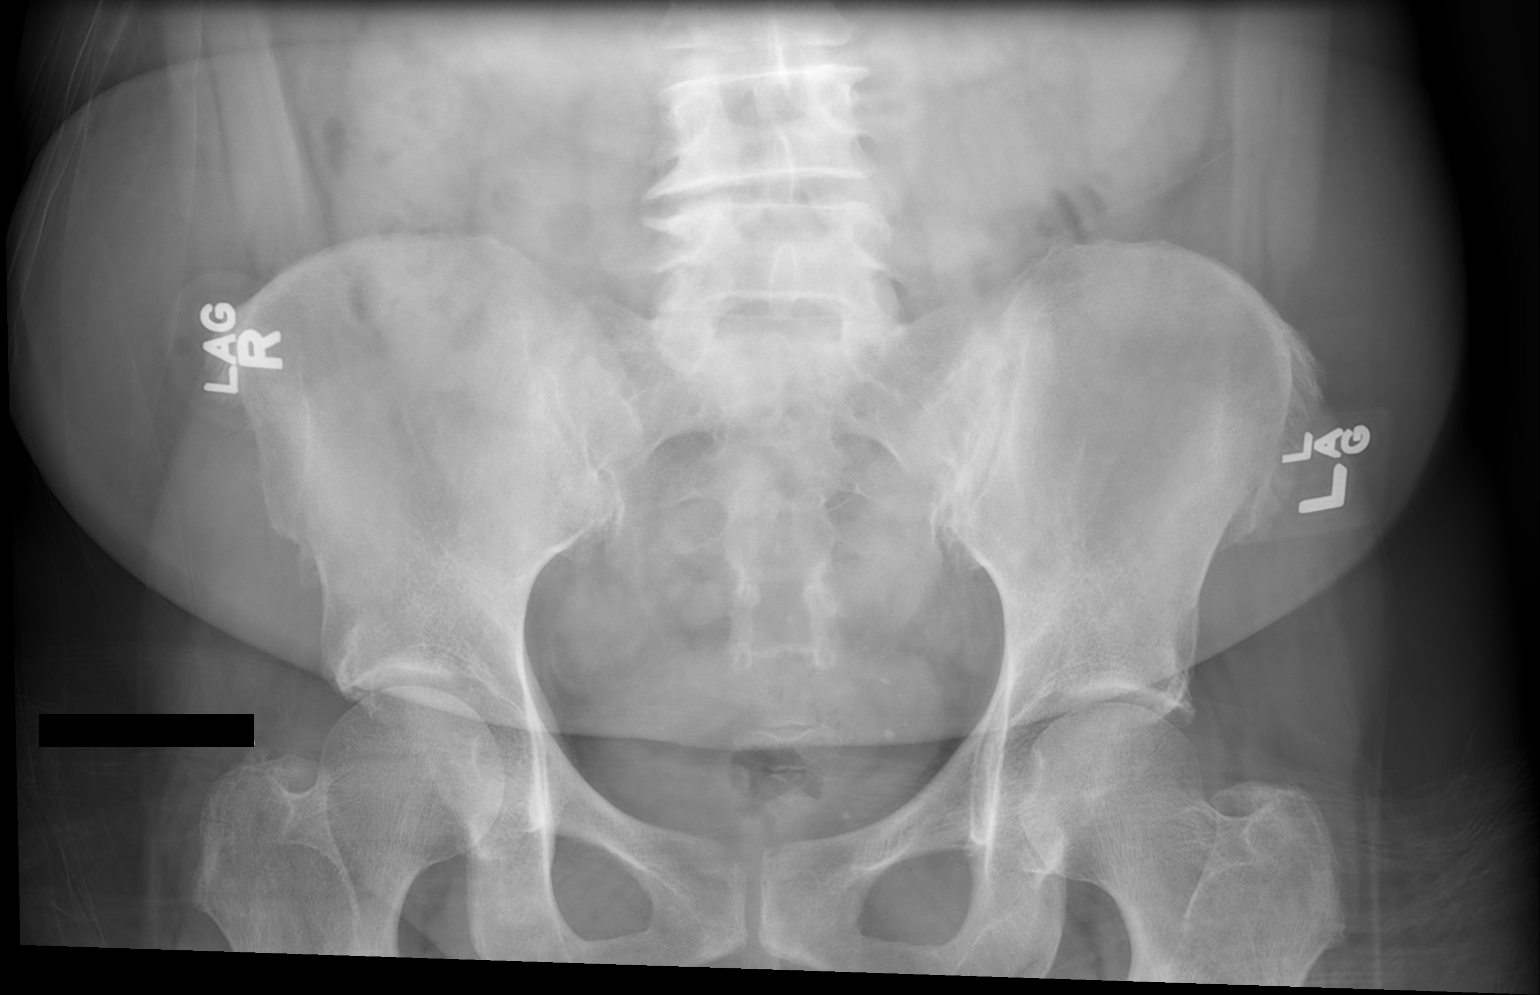
[im 2/3]
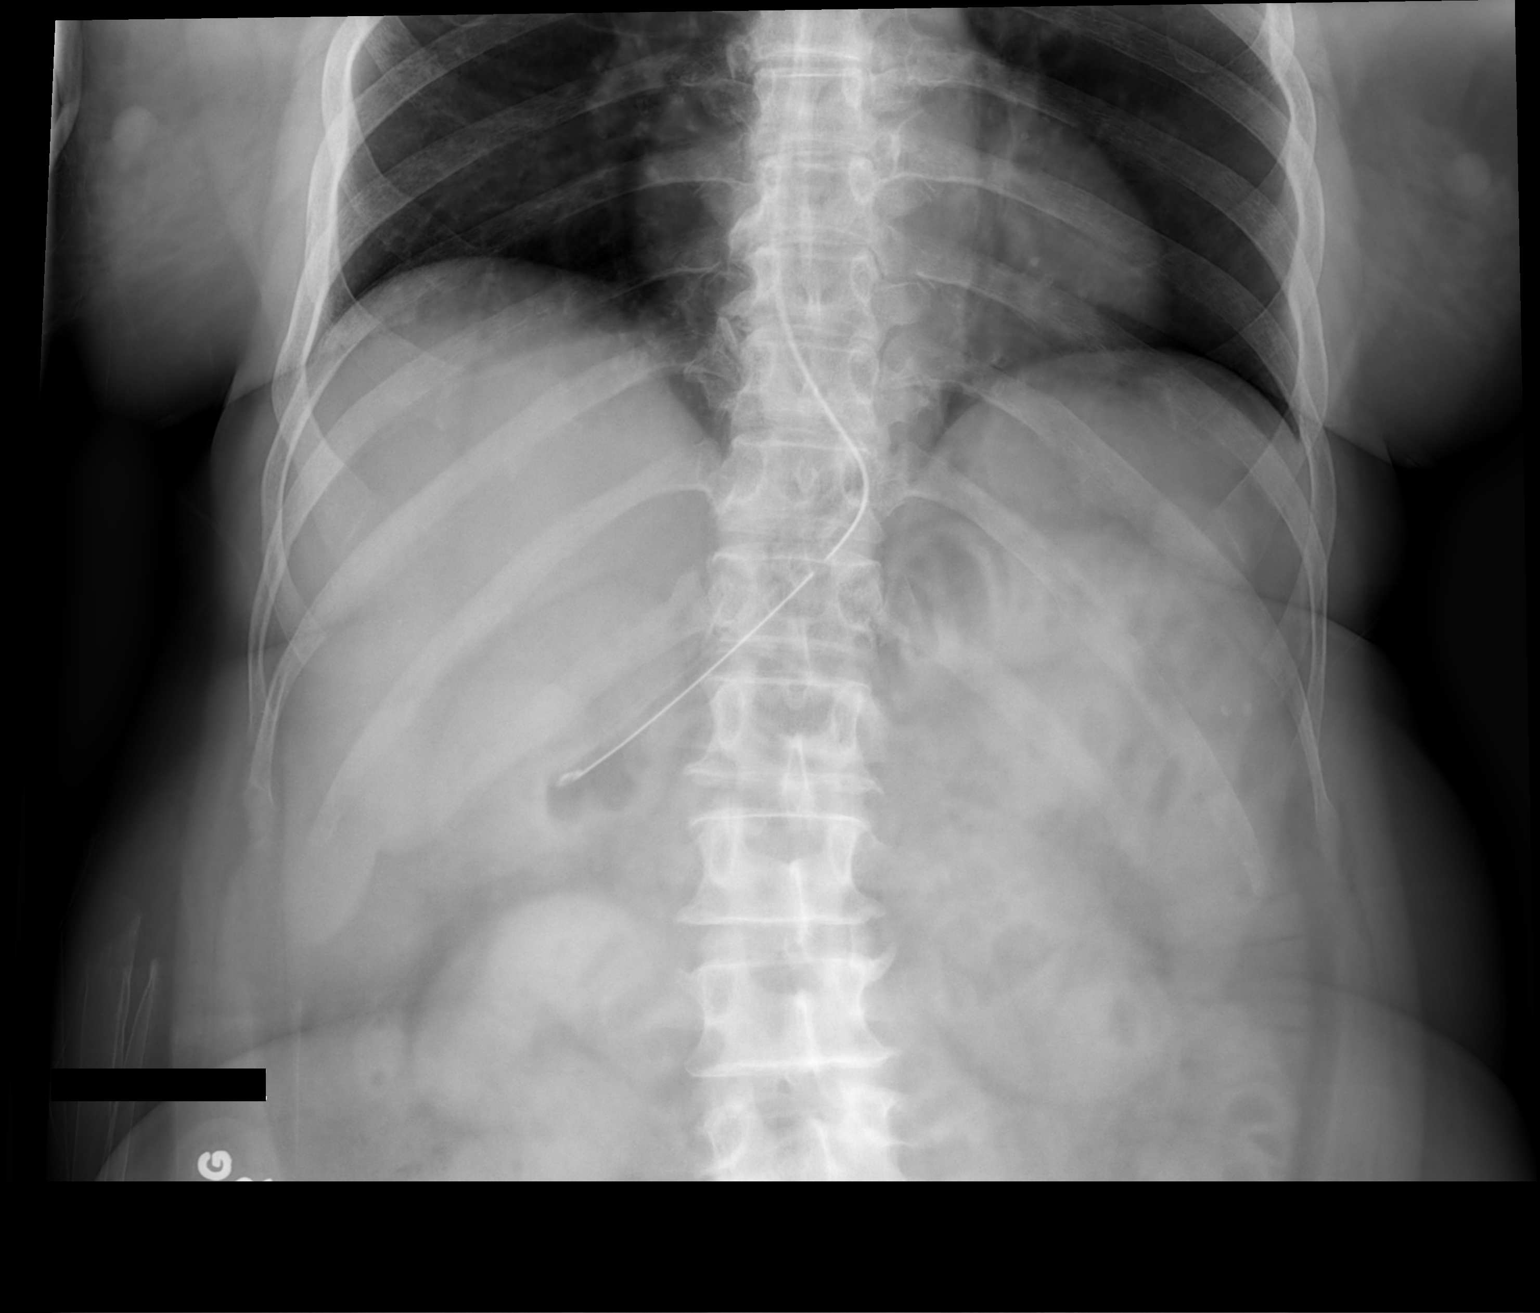
[im 3/3]
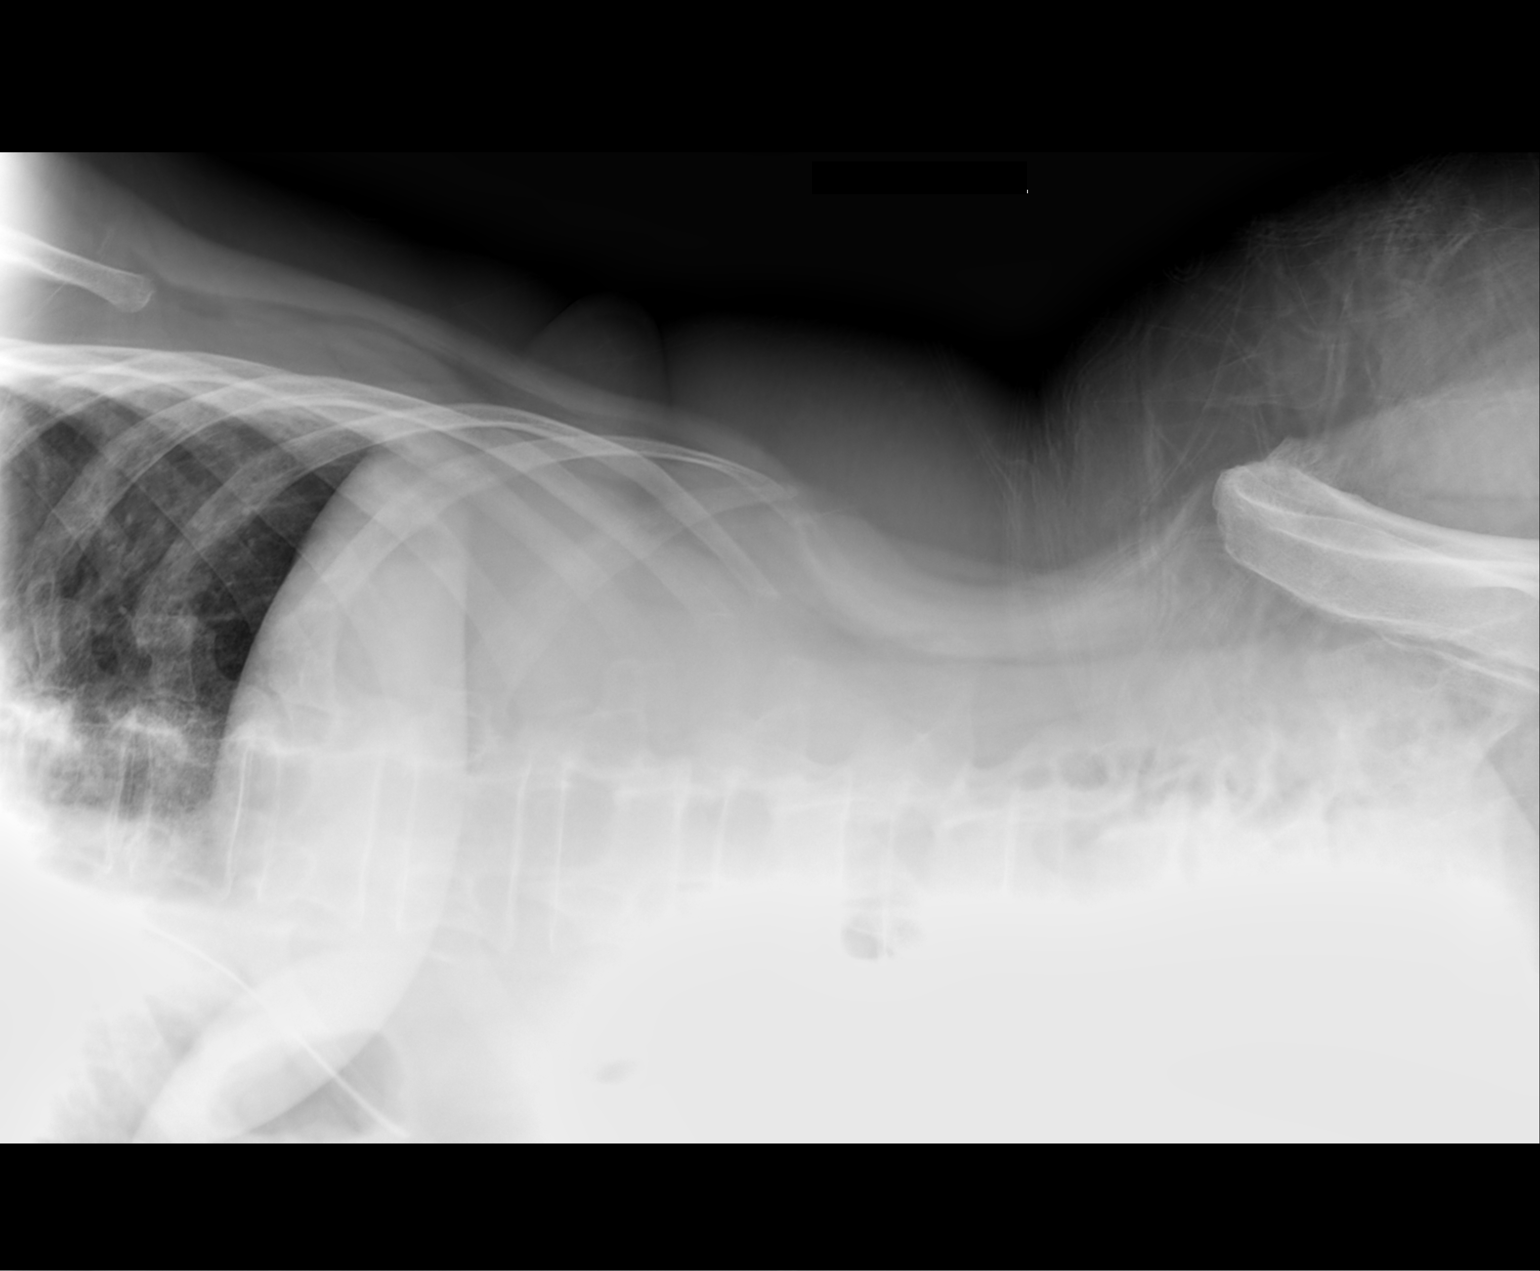

[3 of 3 positions shown; findings below may reference images not displayed]

FINDINGS: Persistent dilatation of small bowel loops containing dilute
contrast.
Nasogastric tube with[REDACTED]ompressed stomach.
No free intraperitoneal air.
Lung bases clear.
Mild degenerative disc disease changes lumbar spine.
Osseous demineralization.
IMPRESSION: Persistent dilatation of fluid-filled small bowel loops consistent
with obstruction.
